# Patient Record
Sex: Female | Born: 1965 | Marital: Married | Smoking: Never smoker
Health system: Southern US, Community
[De-identification: ages and names within clinical notes are randomized; demographics above are authoritative.]

## PROBLEM LIST (undated history)

## (undated) DIAGNOSIS — G43909 Migraine, unspecified, not intractable, without status migrainosus: Secondary | ICD-10-CM

## (undated) DIAGNOSIS — F419 Anxiety disorder, unspecified: Secondary | ICD-10-CM

## (undated) DIAGNOSIS — K760 Fatty (change of) liver, not elsewhere classified: Secondary | ICD-10-CM

## (undated) DIAGNOSIS — Z87442 Personal history of urinary calculi: Secondary | ICD-10-CM

## (undated) DIAGNOSIS — M1990 Unspecified osteoarthritis, unspecified site: Secondary | ICD-10-CM

## (undated) DIAGNOSIS — J45909 Unspecified asthma, uncomplicated: Secondary | ICD-10-CM

## (undated) DIAGNOSIS — J189 Pneumonia, unspecified organism: Secondary | ICD-10-CM

## (undated) HISTORY — DX: Unspecified osteoarthritis, unspecified site: M19.90

## (undated) HISTORY — DX: Anxiety disorder, unspecified: F41.9

## (undated) HISTORY — DX: Personal history of urinary calculi: Z87.442

## (undated) HISTORY — DX: Fatty (change of) liver, not elsewhere classified: K76.0

## (undated) HISTORY — DX: Migraine, unspecified, not intractable, without status migrainosus: G43.909

---

## 2015-12-05 ENCOUNTER — Encounter: Payer: Self-pay | Admitting: Physician Assistant

## 2015-12-05 ENCOUNTER — Ambulatory Visit: Payer: Self-pay | Admitting: Physician Assistant

## 2015-12-05 VITALS — BP 126/68 | HR 77 | Temp 97.7°F | Ht 60.75 in | Wt 141.0 lb

## 2015-12-05 DIAGNOSIS — K219 Gastro-esophageal reflux disease without esophagitis: Secondary | ICD-10-CM

## 2015-12-05 DIAGNOSIS — T148XXA Other injury of unspecified body region, initial encounter: Secondary | ICD-10-CM

## 2015-12-05 DIAGNOSIS — Z131 Encounter for screening for diabetes mellitus: Secondary | ICD-10-CM

## 2015-12-05 DIAGNOSIS — Z13220 Encounter for screening for lipoid disorders: Secondary | ICD-10-CM

## 2015-12-05 LAB — GLUCOSE, POCT (MANUAL RESULT ENTRY): POC Glucose: 84 mg/dL (ref 70–99)

## 2015-12-05 NOTE — Progress Notes (Signed)
BP 126/68 (BP Location: Left Arm, Patient Position: Sitting, Cuff Size: Normal)   Pulse 77   Temp 97.7 F (36.5 C)   Ht 5' 0.75" (1.543 m)   Wt 141 lb (64 kg)   SpO2 99%   BMI 26.86 kg/m    Subjective:    Patient ID: Kathryn Salas, female    DOB: 10/27/1965, 50 y.o.   MRN: [id number]  HPI: Kathryn Salas is a 50 y.o. female presenting on 12/05/2015 for New Patient (Initial Visit)   HPI  [place]  [place] She is poor historian.  Records she has with her show that her Previous meds protonix and topamax.   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  EGD/colonoscopy reprort from 07/10/12- polyp with rec to repeat colonoscopy in 5 year.   Pt says she was given topamax for HA but says she never took it.   Pt says her mammogram something was "off".  She thinks her last one was oct or nov last year.   She says last PAP was done last year.   Relevant past medical, surgical, family and social history reviewed and updated as indicated. Interim medical history since our last visit reviewed. Allergies and medications reviewed and updated.   Current Outpatient Prescriptions:  .  famotidine (PEPCID) 20 MG tablet, Take 20 mg by mouth 3 times/day as needed-between meals & bedtime for heartburn or indigestion., Disp: , Rfl:  .  ibuprofen (ADVIL,MOTRIN) 200 MG tablet, Take 400-600 mg by mouth daily as needed for headache or moderate pain., Disp: , Rfl:  .  methocarbamol (ROBAXIN) 500 MG tablet, Take 500 mg by mouth 4 (four) times daily., Disp: , Rfl:    Review of Systems  Constitutional: Negative for appetite change, chills, diaphoresis, fatigue, fever and unexpected weight change.  HENT: Positive for dental problem. Negative for congestion, drooling, ear pain, facial swelling, hearing loss, mouth sores, sneezing, sore throat, trouble swallowing and voice change.   Eyes: Positive for  redness. Negative for pain, discharge, itching and visual disturbance.  Respiratory: Negative for cough, choking, shortness of breath and wheezing.   Cardiovascular: Negative for chest pain, palpitations and leg swelling.  Gastrointestinal: Negative for abdominal pain, blood in stool, constipation, diarrhea and vomiting.  Endocrine: Negative for cold intolerance, heat intolerance and polydipsia.  Genitourinary: Negative for decreased urine volume, dysuria and hematuria.  Musculoskeletal: Positive for back pain. Negative for arthralgias and gait problem.  Skin: Negative for rash.  Allergic/Immunologic: Negative for environmental allergies.  Neurological: Positive for headaches. Negative for seizures, syncope and light-headedness.  Hematological: Negative for adenopathy.  Psychiatric/Behavioral: Negative for agitation, dysphoric mood and suicidal ideas. The patient is nervous/anxious.     Per HPI unless specifically indicated above     Objective:    BP 126/68 (BP Location: Left Arm, Patient Position: Sitting, Cuff Size: Normal)   Pulse 77   Temp 97.7 F (36.5 C)   Ht 5' 0.75" (1.543 m)   Wt 141 lb (64 kg)   SpO2 99%   BMI 26.86 kg/m   Wt Readings from Last 3 Encounters:  12/05/15 141 lb (64 kg)    Physical Exam  Constitutional: She is oriented to person, place, and time. She appears well-developed and well-nourished.  HENT:  Head: Normocephalic and atraumatic.  Mouth/Throat: Oropharynx is clear and moist. No oropharyngeal exudate.  Eyes: Conjunctivae and EOM are normal. Pupils are equal, round, and reactive to light.  Neck: Neck supple. No thyromegaly present.  Cardiovascular: Normal rate and regular rhythm.   Pulmonary/Chest: Effort normal and breath sounds normal.  Abdominal: Soft. Bowel sounds are normal. She exhibits no mass. There is no hepatosplenomegaly. There is no tenderness.  Musculoskeletal: She exhibits no edema.  Lymphadenopathy:    She has no cervical  adenopathy.  Neurological: She is alert and oriented to person, place, and time. Gait normal.  Skin: Skin is warm and dry. Bruising noted.  Many bruises on all extremities. No bruising seen on torso  Psychiatric: She has a normal mood and affect. Her behavior is normal.  Vitals reviewed.      Results for orders placed or performed in visit on 12/05/15  POCT Glucose (CBG)  Result Value Ref Range   POC Glucose 84 70 - 99 mg/dl      Assessment & Plan:   Encounter Diagnoses  Name Primary?  . Screening for diabetes mellitus (DM) Yes  . Screening cholesterol level   . Bruising   . Gastroesophageal reflux disease, esophagitis presence not specified      -Check baseline labs -record request sent for last eye exam, mammogram and pap -F/u 1 month.  RTO sooner prn

## 2015-12-07 LAB — CBC WITH DIFFERENTIAL/PLATELET
BASOS PCT: 0 %
Basophils Absolute: 0 cells/uL (ref 0–200)
EOS ABS: 180 {cells}/uL (ref 15–500)
Eosinophils Relative: 3 %
HEMATOCRIT: 39.9 % (ref 35.0–45.0)
Hemoglobin: 13.4 g/dL (ref 11.7–15.5)
Lymphocytes Relative: 38 %
Lymphs Abs: 2280 cells/uL (ref 850–3900)
MCH: 28.9 pg (ref 27.0–33.0)
MCHC: 33.6 g/dL (ref 32.0–36.0)
MCV: 86 fL (ref 80.0–100.0)
MONO ABS: 360 {cells}/uL (ref 200–950)
MPV: 10.5 fL (ref 7.5–12.5)
Monocytes Relative: 6 %
NEUTROS ABS: 3180 {cells}/uL (ref 1500–7800)
Neutrophils Relative %: 53 %
PLATELETS: 218 10*3/uL (ref 140–400)
RBC: 4.64 MIL/uL (ref 3.80–5.10)
RDW: 13.2 % (ref 11.0–15.0)
WBC: 6 10*3/uL (ref 3.8–10.8)

## 2015-12-08 LAB — COMPREHENSIVE METABOLIC PANEL
ALT: 14 U/L (ref 6–29)
AST: 18 U/L (ref 10–35)
Albumin: 4.5 g/dL (ref 3.6–5.1)
Alkaline Phosphatase: 69 U/L (ref 33–115)
BUN: 18 mg/dL (ref 7–25)
CO2: 27 mmol/L (ref 20–31)
Calcium: 10.1 mg/dL (ref 8.6–10.2)
Chloride: 104 mmol/L (ref 98–110)
Creat: 0.67 mg/dL (ref 0.50–1.10)
GLUCOSE: 81 mg/dL (ref 65–99)
Potassium: 5.1 mmol/L (ref 3.5–5.3)
SODIUM: 140 mmol/L (ref 135–146)
TOTAL PROTEIN: 7.5 g/dL (ref 6.1–8.1)
Total Bilirubin: 0.6 mg/dL (ref 0.2–1.2)

## 2015-12-08 LAB — HEMOGLOBIN A1C
HEMOGLOBIN A1C: 5.4 % (ref <5.7)
Mean Plasma Glucose: 108 mg/dL

## 2015-12-08 LAB — LIPID PANEL
Cholesterol: 182 mg/dL (ref 125–200)
HDL: 69 mg/dL (ref >=46)
LDL CALC: 101 mg/dL (ref <130)
TRIGLYCERIDES: 60 mg/dL (ref <150)
Total CHOL/HDL Ratio: 2.6 Ratio (ref <=5.0)
VLDL: 12 mg/dL (ref <30)

## 2015-12-08 LAB — PROTIME-INR
INR: 1
Prothrombin Time: 10.2 s (ref 9.0–11.5)

## 2016-01-02 ENCOUNTER — Ambulatory Visit: Payer: Self-pay | Admitting: Physician Assistant

## 2016-01-04 ENCOUNTER — Ambulatory Visit: Payer: Self-pay | Admitting: Physician Assistant

## 2016-01-04 ENCOUNTER — Encounter: Payer: Self-pay | Admitting: Physician Assistant

## 2016-01-04 VITALS — BP 126/78 | HR 78 | Ht 60.75 in | Wt 145.0 lb

## 2016-01-04 DIAGNOSIS — G8929 Other chronic pain: Secondary | ICD-10-CM

## 2016-01-04 DIAGNOSIS — K219 Gastro-esophageal reflux disease without esophagitis: Secondary | ICD-10-CM | POA: Insufficient documentation

## 2016-01-04 DIAGNOSIS — Z1239 Encounter for other screening for malignant neoplasm of breast: Secondary | ICD-10-CM

## 2016-01-04 DIAGNOSIS — M549 Dorsalgia, unspecified: Secondary | ICD-10-CM

## 2016-01-04 NOTE — Progress Notes (Signed)
BP 126/78 (BP Location: Left Arm, Patient Position: Sitting, Cuff Size: Normal)   Pulse 78   Ht 5' 0.75" (1.543 m)   Wt 145 lb (65.8 kg)   SpO2 98%   BMI 27.62 kg/m    Subjective:    Patient ID: Kathryn Salas, female    DOB: 05/05/1965, 50 y.o.   MRN: [id number]  HPI: Kathryn Salas is a 50 y.o. female presenting on 01/04/2016 for No chief complaint on file.   HPI   Pt is here for follow up  Relevant past medical, surgical, family and social history reviewed and updated as indicated. Interim medical history since our last visit reviewed. Allergies and medications reviewed and updated.   Current Outpatient Prescriptions:  .  famotidine (PEPCID) 20 MG tablet, Take 20 mg by mouth 3 times/day as needed-between meals & bedtime for heartburn or indigestion., Disp: , Rfl:  .  ibuprofen (ADVIL,MOTRIN) 200 MG tablet, Take 400-600 mg by mouth daily as needed for headache or moderate pain., Disp: , Rfl:  .  methocarbamol (ROBAXIN) 500 MG tablet, Take 500 mg by mouth 4 (four) times daily., Disp: , Rfl:    Review of Systems  Constitutional: Negative for appetite change, chills, diaphoresis, fatigue, fever and unexpected weight change.  HENT: Positive for dental problem. Negative for congestion, drooling, ear pain, facial swelling, hearing loss, mouth sores, sneezing, sore throat, trouble swallowing and voice change.   Eyes: Negative for pain, discharge, redness, itching and visual disturbance.  Respiratory: Negative for cough, choking, shortness of breath and wheezing.   Cardiovascular: Negative for chest pain, palpitations and leg swelling.  Gastrointestinal: Negative for abdominal pain, blood in stool, constipation, diarrhea and vomiting.  Endocrine: Negative for cold intolerance, heat intolerance and polydipsia.  Genitourinary: Negative for decreased urine volume, dysuria and hematuria.  Musculoskeletal: Positive for arthralgias and back pain. Negative for gait problem.  Skin: Negative  for rash.  Allergic/Immunologic: Negative for environmental allergies.  Neurological: Negative for seizures, syncope, light-headedness and headaches.  Hematological: Negative for adenopathy.  Psychiatric/Behavioral: Negative for agitation, dysphoric mood and suicidal ideas. The patient is not nervous/anxious.     Per HPI unless specifically indicated above     Objective:    BP 126/78 (BP Location: Left Arm, Patient Position: Sitting, Cuff Size: Normal)   Pulse 78   Ht 5' 0.75" (1.543 m)   Wt 145 lb (65.8 kg)   SpO2 98%   BMI 27.62 kg/m   Wt Readings from Last 3 Encounters:  01/04/16 145 lb (65.8 kg)  12/05/15 141 lb (64 kg)    Physical Exam  Constitutional: She is oriented to person, place, and time. She appears well-developed and well-nourished.  HENT:  Head: Normocephalic and atraumatic.  Neck: Neck supple.  Cardiovascular: Normal rate and regular rhythm.   Pulmonary/Chest: Effort normal and breath sounds normal.  Abdominal: Soft. Bowel sounds are normal. She exhibits no mass. There is no hepatosplenomegaly. There is no tenderness.  Musculoskeletal: She exhibits no edema.  Lymphadenopathy:    She has no cervical adenopathy.  Neurological: She is alert and oriented to person, place, and time.  Skin: Skin is warm and dry.  Psychiatric: She has a normal mood and affect. Her behavior is normal.  Vitals reviewed.   Results for orders placed or performed in visit on 12/05/15  HgB A1c  Result Value Ref Range   Hgb A1c MFr Bld 5.4 <5.7 %   Mean Plasma Glucose 108 mg/dL  Lipid Profile  Result Value Ref Range  Cholesterol 182 125 - 200 mg/dL   Triglycerides 60 <150 mg/dL   HDL 69 >=46 mg/dL   Total CHOL/HDL Ratio 2.6 <=5.0 Ratio   VLDL 12 <30 mg/dL   LDL Cholesterol 101 <130 mg/dL  Comprehensive Metabolic Panel (CMET)  Result Value Ref Range   Sodium 140 135 - 146 mmol/L   Potassium 5.1 3.5 - 5.3 mmol/L   Chloride 104 98 - 110 mmol/L   CO2 27 20 - 31 mmol/L    Glucose, Bld 81 65 - 99 mg/dL   BUN 18 7 - 25 mg/dL   Creat 0.67 0.50 - 1.10 mg/dL   Total Bilirubin 0.6 0.2 - 1.2 mg/dL   Alkaline Phosphatase 69 33 - 115 U/L   AST 18 10 - 35 U/L   ALT 14 6 - 29 U/L   Total Protein 7.5 6.1 - 8.1 g/dL   Albumin 4.5 3.6 - 5.1 g/dL   Calcium 10.1 8.6 - 10.2 mg/dL  CBC w/Diff/Platelet  Result Value Ref Range   WBC 6.0 3.8 - 10.8 K/uL   RBC 4.64 3.80 - 5.10 MIL/uL   Hemoglobin 13.4 11.7 - 15.5 g/dL   HCT 39.9 35.0 - 45.0 %   MCV 86.0 80.0 - 100.0 fL   MCH 28.9 27.0 - 33.0 pg   MCHC 33.6 32.0 - 36.0 g/dL   RDW 13.2 11.0 - 15.0 %   Platelets 218 140 - 400 K/uL   MPV 10.5 7.5 - 12.5 fL   Neutro Abs 3,180 1,500 - 7,800 cells/uL   Lymphs Abs 2,280 850 - 3,900 cells/uL   Monocytes Absolute 360 200 - 950 cells/uL   Eosinophils Absolute 180 15 - 500 cells/uL   Basophils Absolute 0 0 - 200 cells/uL   Neutrophils Relative % 53 %   Lymphocytes Relative 38 %   Monocytes Relative 6 %   Eosinophils Relative 3 %   Basophils Relative 0 %   Smear Review Criteria for review not met   INR/PT  Result Value Ref Range   Prothrombin Time 10.2 9.0 - 11.5 sec   INR 1.0   POCT Glucose (CBG)  Result Value Ref Range   POC Glucose 84 70 - 99 mg/dl      Assessment & Plan:   Encounter Diagnoses  Name Primary?  . Gastroesophageal reflux disease, esophagitis presence not specified Yes  . Screening for breast cancer   . Chronic back pain, unspecified back location, unspecified back pain laterality      -reviewed labs with pt -screening mammogram -will check to see if there is funding for eye doctor -pt encouraged to Check for local pain clinic- discussed with pt we don't manage chronic pain -follow up in may for PAP.  RTO sooner prn

## 2016-01-31 ENCOUNTER — Encounter (HOSPITAL_COMMUNITY): Payer: Self-pay

## 2016-02-22 ENCOUNTER — Encounter: Payer: Self-pay | Admitting: Physician Assistant

## 2016-02-22 ENCOUNTER — Ambulatory Visit: Payer: Self-pay | Admitting: Physician Assistant

## 2016-02-22 VITALS — BP 118/74 | HR 71 | Temp 97.7°F | Ht 60.75 in | Wt 148.4 lb

## 2016-02-22 DIAGNOSIS — K219 Gastro-esophageal reflux disease without esophagitis: Secondary | ICD-10-CM

## 2016-02-22 DIAGNOSIS — Z86010 Personal history of colonic polyps: Secondary | ICD-10-CM

## 2016-02-22 MED ORDER — PANTOPRAZOLE SODIUM 40 MG PO TBEC: DELAYED_RELEASE_TABLET | ORAL | 3 refills | Status: DC

## 2016-02-22 NOTE — Patient Instructions (Signed)
Gammagrafa de reflujo gastroesofgico (Gastroesophageal Reflux Scan) La gammagrafa de reflujo gastroesofgico es un procedimiento que se realiza para Landscape architectdetectar la presencia de reflujo gastroesofgico, que es el flujo retrgrado de los contenidos estomacales hacia el conducto que lleva los alimentos desde la boca hasta el estmago (esfago). Adems, puede revelar si se produce la inhalacin (aspiracin) de estos contenidos RadioShackhacia los pulmones. Tal vez deba realizarse esta gammagrafa si tiene sntomas, como acidez, vmitos, problemas de deglucin o regurgitacin. La regurgitacin ocurre cuando los alimentos ingeridos regresan desde el estmago al esfago. Para este estudio, beber un lquido que contiene una pequea cantidad de una sustancia radiactiva Radiographer, therapeutic(marcador). Se B[place]ausa un escner con Neomia Dearuna cmara que detecta el marcador radiactivo para determinar si el material regresa al esfago. INFORME A SU MDICO:  Cualquier alergia que tenga.  Todos los Walt Disneymedicamentos que utiliza, incluidos vitaminas, hierbas, gotas oftlmicas, cremas y [address]amentos de [address] libre.  Enfermedades de la sangre que tenga.  Si tiene cirugas previas.  Si tiene Owens-Illinoisalguna enfermedad.  Si est embarazada o cree que podra estarlo.  Si est amamantando. RIESGOS Y COMPLICACIONES En general, se trata de un procedimiento seguro. Sin embargo, pueden presentarse problemas, por ejemplo:  Exposicin a la radiacin (pequea cantidad).  Reaccin alrgica a la sustancia radiactiva. Esto es raro. ANTES DEL PROCEDIMIENTO  Consulte a su mdico si debe cambiar o suspender los medicamentos que toma habitualmente. Esto es muy importante si toma medicamentos para la diabetes o anticoagulantes.  Siga las indicaciones del mdico respecto de las restricciones para las comidas o las bebidas. PROCEDIMIENTO  Se le indicar que beba un lquido que contiene una pequea cantidad de un marcador radiactivo. Es probable que este lquido sea parecido al  jugo de naranjas.  Se acostar boca arriba.  Le tomarn una serie de imgenes del esfago y de la parte superior del Homesteadestmago.  Tal vez le pidan que cambie de posicin para ayudar a Chief Strategy Officerdeterminar si el reflujo es ms frecuente cuando adopta posiciones especficas.  Es posible que a los adultos se les coloque una faja abdominal con una almohadilla inflable sobre el vientre (abdomen), la cual puede usarse para aumentar la presin abdominal. Le tomarn ms imgenes para saber si se produce reflujo al aumentar la presin. Este procedimiento puede variar segn el mdico y el hospital. DESPUS DEL PROCEDIMIENTO  Reanude su dieta y sus actividades normales como se lo haya indicado el mdico.  El marcador radiactivo se eliminar del cuerpo luego de Culbertsonalgunos das. Beba suficiente lquido para Photographermantener la orina clara o de color amarillo plido. Esto ayudar a Event organisereliminar el marcador del cuerpo.  Es su responsabilidad retirar el resultado del [place]udio. Pregntele al mdico o consulte en el departamento donde se realice el estudio cundo y cmo obtendr los Abandaresultados. Esta informacin no tiene Theme park managercomo fin reemplazar el consejo del mdico. Asegrese de hacerle al mdico cualquier pregunta que tenga. Document Released: 12/23/2012 Document Revised: 03/25/2014 Elsevier Interactive Patient Education  2017 [employer]

## 2016-02-22 NOTE — Progress Notes (Signed)
BP 118/74 (BP Location: Left Arm, Patient Position: Sitting, Cuff Size: Normal)   Pulse 71   Temp 97.7 F (36.5 C) (Other (Comment))   Ht 5' 0.75" (1.543 m)   Wt 148 lb 6.4 oz (67.3 kg)   SpO2 99%   BMI 28.27 kg/m    Subjective:    Patient ID: Kathryn Salas, female    DOB: 08/26/1965, 50 y.o.   MRN: [id number]  HPI: Kathryn Salas is a 50 y.o. female presenting on 02/22/2016 for Abdominal Pain (c/o painful acid reflux for 3 weeks)   HPI  Pt takes IBU only when she has HA, maybe 10 or 12 times/month.   She started taking the otc omeprazole 2 or 3 wk ago.    Stomach pain comes and goes. Says every time he eats it hurts . She gets nausea after eating but no emesis.  BMs normal.  Pt states EGD and colonoscopy 2 years ago. Found polyp but egd normal  Egd/colonoscopy- 02/15/15- polyp, h pylori negative, rec repeat 5 year ([facility], [place])  Relevant past medical, surgical, family and social history reviewed and updated as indicated. Interim medical history since our last visit reviewed. Allergies and medications reviewed and updated.   Current Outpatient Prescriptions:  .  famotidine (PEPCID) 20 MG tablet, Take 20 mg by mouth 3 times/day as needed-between meals & bedtime for heartburn or indigestion., Disp: , Rfl:  .  ibuprofen (ADVIL,MOTRIN) 200 MG tablet, Take 400-600 mg by mouth daily as needed for headache or moderate pain., Disp: , Rfl:  .  methocarbamol (ROBAXIN) 500 MG tablet, Take 500 mg by mouth 4 (four) times daily as needed. , Disp: , Rfl:  .  omeprazole (PRILOSEC OTC) 20 MG tablet, Take 20 mg by mouth daily., Disp: , Rfl:    Review of Systems  Constitutional: Negative for appetite change, chills, diaphoresis, fatigue, fever and unexpected weight change.  HENT: Positive for dental problem. Negative for congestion, drooling, ear pain, facial swelling, hearing loss, mouth sores, sneezing, sore throat, trouble swallowing and voice change.   Eyes:  Negative for pain, discharge, redness, itching and visual disturbance.  Respiratory: Negative for cough, choking, shortness of breath and wheezing.   Cardiovascular: Negative for chest pain, palpitations and leg swelling.  Gastrointestinal: Positive for abdominal pain. Negative for blood in stool, constipation, diarrhea and vomiting.  Endocrine: Negative for cold intolerance, heat intolerance and polydipsia.  Genitourinary: Negative for decreased urine volume, dysuria and hematuria.  Musculoskeletal: Positive for arthralgias and back pain. Negative for gait problem.  Skin: Negative for rash.  Allergic/Immunologic: Negative for environmental allergies.  Neurological: Negative for seizures, syncope, light-headedness and headaches.  Hematological: Negative for adenopathy.  Psychiatric/Behavioral: Negative for agitation, dysphoric mood and suicidal ideas. The patient is not nervous/anxious.     Per HPI unless specifically indicated above     Objective:    BP 118/74 (BP Location: Left Arm, Patient Position: Sitting, Cuff Size: Normal)   Pulse 71   Temp 97.7 F (36.5 C) (Other (Comment))   Ht 5' 0.75" (1.543 m)   Wt 148 lb 6.4 oz (67.3 kg)   SpO2 99%   BMI 28.27 kg/m   Wt Readings from Last 3 Encounters:  02/22/16 148 lb 6.4 oz (67.3 kg)  01/04/16 145 lb (65.8 kg)  12/05/15 141 lb (64 kg)    Physical Exam  Constitutional: She is oriented to person, place, and time. She appears well-developed and well-nourished.  HENT:  Head: Normocephalic and atraumatic.  Right Ear: Hearing normal.  Left Ear: Hearing normal.  Neck: Neck supple.  Cardiovascular: Normal rate and regular rhythm.   Pulmonary/Chest: Effort normal and breath sounds normal. She has no wheezes.  Abdominal: Soft. Bowel sounds are normal. She exhibits no mass. There is no hepatosplenomegaly. There is no tenderness.  Musculoskeletal: She exhibits no edema.  Lymphadenopathy:    She has no cervical adenopathy.   Neurological: She is alert and oriented to person, place, and time.  Skin: Skin is warm and dry.  Psychiatric: She has a normal mood and affect. Her behavior is normal.  Vitals reviewed.      Assessment & Plan:   Encounter Diagnoses  Name Primary?  . Gastroesophageal reflux disease, esophagitis presence not specified Yes  . History of colonic polyps     Stop pepcid and omeprazole.  rx protonix  Counseled on lifestyle changes and gave handout  F/u 1 month to recheck

## 2016-02-29 ENCOUNTER — Ambulatory Visit (HOSPITAL_COMMUNITY)
Admission: RE | Admit: 2016-02-29 | Discharge: 2016-02-29 | Disposition: A | Discharge status: Home / Self Care | Payer: Self-pay | Source: Ambulatory Visit | Attending: Physician Assistant | Admitting: Physician Assistant

## 2016-02-29 ENCOUNTER — Other Ambulatory Visit: Payer: Self-pay | Admitting: Physician Assistant

## 2016-02-29 ENCOUNTER — Ambulatory Visit (HOSPITAL_COMMUNITY): Admission: RE | Admit: 2016-02-29 | Payer: Self-pay | Source: Ambulatory Visit

## 2016-02-29 ENCOUNTER — Encounter (HOSPITAL_COMMUNITY): Payer: Self-pay

## 2016-02-29 DIAGNOSIS — Z1231 Encounter for screening mammogram for malignant neoplasm of breast: Secondary | ICD-10-CM

## 2016-04-03 ENCOUNTER — Ambulatory Visit: Payer: Self-pay | Admitting: Physician Assistant

## 2016-04-04 ENCOUNTER — Ambulatory Visit: Payer: Self-pay | Admitting: Physician Assistant

## 2016-04-10 ENCOUNTER — Ambulatory Visit: Payer: Self-pay | Admitting: Physician Assistant

## 2016-04-11 ENCOUNTER — Encounter: Payer: Self-pay | Admitting: Physician Assistant

## 2016-04-16 ENCOUNTER — Encounter: Payer: Self-pay | Admitting: Physician Assistant

## 2016-04-16 ENCOUNTER — Ambulatory Visit: Payer: Self-pay | Admitting: Physician Assistant

## 2016-04-16 VITALS — BP 126/70 | HR 83 | Temp 97.7°F | Wt 150.2 lb

## 2016-04-16 DIAGNOSIS — K219 Gastro-esophageal reflux disease without esophagitis: Secondary | ICD-10-CM

## 2016-04-16 DIAGNOSIS — R5383 Other fatigue: Secondary | ICD-10-CM

## 2016-04-16 LAB — TSH: TSH: 1.67 m[IU]/L

## 2016-04-16 LAB — HEMOGLOBIN: HEMOGLOBIN: 13.1 g/dL (ref 11.7–15.5)

## 2016-04-16 NOTE — Progress Notes (Signed)
BP 126/70 (BP Location: Left Arm, Patient Position: Sitting, Cuff Size: Normal)   Pulse 83   Temp 97.7 F (36.5 C)   Wt 150 lb 4 oz (68.2 kg)   SpO2 97%   BMI 28.62 kg/m    Subjective:    Patient ID: Kathryn Salas, female    DOB: 21-Dec-1965, 51 y.o.   MRN: [id number]  HPI: Kathryn Salas is a 51 y.o. female presenting on 04/16/2016 for Gastroesophageal Reflux and Fatigue (pt states at times she feels with little energy)   HPI   Chief Complaint  Patient presents with  . Gastroesophageal Reflux  . Fatigue    pt states at times she feels with little energy    Pt has some heartburn symptoms.  She did not get her meds refilled.   Pt cleans houses for work.  She feels tired and sleepy sometimes. Started more within last week or two.  She gets 8 hour sleep most nights.   She started going to [facility] about 3 months ago.   Relevant past medical, surgical, family and social history reviewed and updated as indicated. Interim medical history since our last visit reviewed. Allergies and medications reviewed and updated.  . Current Outpatient Prescriptions:  .  ibuprofen (ADVIL,MOTRIN) 200 MG tablet, Take 400-600 mg by mouth daily as needed for headache or moderate pain., Disp: , Rfl:  .  famotidine (PEPCID) 20 MG tablet, Take 20 mg by mouth 3 times/day as needed-between meals & bedtime for heartburn or indigestion., Disp: , Rfl:  .  methocarbamol (ROBAXIN) 500 MG tablet, Take 500 mg by mouth 4 (four) times daily as needed. , Disp: , Rfl:  .  omeprazole (PRILOSEC OTC) 20 MG tablet, Take 20 mg by mouth daily., Disp: , Rfl:  .  pantoprazole (PROTONIX) 40 MG tablet, 1 po bid.  Tome una tableta por boca dos veces diarias (Patient not taking: Reported on 04/16/2016), Disp: 60 tablet, Rfl: 3  Review of Systems  Constitutional: Negative for appetite change, chills, diaphoresis, fatigue, fever and unexpected weight change.  HENT: Positive for dental problem. Negative for congestion, drooling, ear  pain, facial swelling, hearing loss, mouth sores, sneezing, sore throat, trouble swallowing and voice change.   Eyes: Negative for pain, discharge, redness, itching and visual disturbance.  Respiratory: Negative for cough, choking, shortness of breath and wheezing.   Cardiovascular: Negative for chest pain, palpitations and leg swelling.  Gastrointestinal: Negative for abdominal pain, blood in stool, constipation, diarrhea and vomiting.  Endocrine: Negative for cold intolerance, heat intolerance and polydipsia.  Genitourinary: Negative for decreased urine volume, dysuria and hematuria.  Musculoskeletal: Positive for arthralgias and back pain. Negative for gait problem.  Skin: Negative for rash.  Allergic/Immunologic: Negative for environmental allergies.  Neurological: Positive for headaches. Negative for seizures, syncope and light-headedness.  Hematological: Negative for adenopathy.  Psychiatric/Behavioral: Negative for agitation, dysphoric mood and suicidal ideas. The patient is not nervous/anxious.     Per HPI unless specifically indicated above     Objective:    BP 126/70 (BP Location: Left Arm, Patient Position: Sitting, Cuff Size: Normal)   Pulse 83   Temp 97.7 F (36.5 C)   Wt 150 lb 4 oz (68.2 kg)   SpO2 97%   BMI 28.62 kg/m   Wt Readings from Last 3 Encounters:  04/16/16 150 lb 4 oz (68.2 kg)  02/22/16 148 lb 6.4 oz (67.3 kg)  01/04/16 145 lb (65.8 kg)    Physical Exam  Constitutional: She is oriented to  person, place, and time. She appears well-developed and well-nourished.  HENT:  Head: Normocephalic and atraumatic.  Neck: Neck supple. No thyroid mass and no thyromegaly present.  Cardiovascular: Normal rate and regular rhythm.   Pulmonary/Chest: Effort normal and breath sounds normal.  Abdominal: Soft. Bowel sounds are normal. She exhibits no mass. There is no hepatosplenomegaly. There is no tenderness.  Musculoskeletal: She exhibits no edema.  Lymphadenopathy:     She has no cervical adenopathy.  Neurological: She is alert and oriented to person, place, and time.  Skin: Skin is warm and dry.  Psychiatric: She has a normal mood and affect. Her behavior is normal.  Vitals reviewed.   Results for orders placed or performed in visit on 12/05/15  HgB A1c  Result Value Ref Range   Hgb A1c MFr Bld 5.4 <5.7 %   Mean Plasma Glucose 108 mg/dL  Lipid Profile  Result Value Ref Range   Cholesterol 182 125 - 200 mg/dL   Triglycerides 60 <150 mg/dL   HDL 69 >=46 mg/dL   Total CHOL/HDL Ratio 2.6 <=5.0 Ratio   VLDL 12 <30 mg/dL   LDL Cholesterol 101 <130 mg/dL  Comprehensive Metabolic Panel (CMET)  Result Value Ref Range   Sodium 140 135 - 146 mmol/L   Potassium 5.1 3.5 - 5.3 mmol/L   Chloride 104 98 - 110 mmol/L   CO2 27 20 - 31 mmol/L   Glucose, Bld 81 65 - 99 mg/dL   BUN 18 7 - 25 mg/dL   Creat 0.67 0.50 - 1.10 mg/dL   Total Bilirubin 0.6 0.2 - 1.2 mg/dL   Alkaline Phosphatase 69 33 - 115 U/L   AST 18 10 - 35 U/L   ALT 14 6 - 29 U/L   Total Protein 7.5 6.1 - 8.1 g/dL   Albumin 4.5 3.6 - 5.1 g/dL   Calcium 10.1 8.6 - 10.2 mg/dL  CBC w/Diff/Platelet  Result Value Ref Range   WBC 6.0 3.8 - 10.8 K/uL   RBC 4.64 3.80 - 5.10 MIL/uL   Hemoglobin 13.4 11.7 - 15.5 g/dL   HCT 39.9 35.0 - 45.0 %   MCV 86.0 80.0 - 100.0 fL   MCH 28.9 27.0 - 33.0 pg   MCHC 33.6 32.0 - 36.0 g/dL   RDW 13.2 11.0 - 15.0 %   Platelets 218 140 - 400 K/uL   MPV 10.5 7.5 - 12.5 fL   Neutro Abs 3,180 1,500 - 7,800 cells/uL   Lymphs Abs 2,280 850 - 3,900 cells/uL   Monocytes Absolute 360 200 - 950 cells/uL   Eosinophils Absolute 180 15 - 500 cells/uL   Basophils Absolute 0 0 - 200 cells/uL   Neutrophils Relative % 53 %   Lymphocytes Relative 38 %   Monocytes Relative 6 %   Eosinophils Relative 3 %   Basophils Relative 0 %   Smear Review Criteria for review not met   INR/PT  Result Value Ref Range   Prothrombin Time 10.2 9.0 - 11.5 sec   INR 1.0   POCT Glucose  (CBG)  Result Value Ref Range   POC Glucose 84 70 - 99 mg/dl      Assessment & Plan:   Encounter Diagnoses  Name Primary?  . Gastroesophageal reflux disease, esophagitis presence not specified Yes  . Fatigue, unspecified type      -reviewed labs with pt.  Will check TSH in light of fatigue but discussed with pt likely due to her activity  Level.  Encouraged plenty  of sleep, nutritious diet and regular exercise to help avoid fatigue -encouraged pt to refill her meds to prevent the return of her GERD symptoms -follow up as scheduled.  RTO sooner prn

## 2016-04-16 NOTE — Patient Instructions (Signed)
Fatiga  (Fatigue)  La fatiga es una sensación de cansancio en todo momento, falta de energía o falta de motivación. La fatiga ocasional o leve con frecuencia es una reacción normal a la actividad o la vida en general. Sin embargo, la fatiga de larga duración (crónica) o extrema puede indicar una enfermedad preexistente.  INSTRUCCIONES PARA EL CUIDADO EN EL HOGAR  Controle su fatiga para ver si hay cambios. Las siguientes indicaciones ayudarán a aliviar cualquier molestia que pueda sentir:  · Hable con el médico acerca de cuánto debe dormir cada noche. Trate de dormir la cantidad de tiempo requerida todas las noches.  · Tome los medicamentos solamente como se lo haya indicado el médico.  · Siga una dieta saludable y nutritiva. Pida ayuda al médico si necesita hacer cambios en su dieta.  · Beba suficiente líquido para mantener la orina clara o de color amarillo pálido.  · Practique actividades que lo relajen, como yoga, meditación, terapia de masajes o acupuntura.  · Haga ejercicios regularmente.  · Cambie las situaciones que le provocan estrés. Trate de que su rutina de trabajo y personal sea moderada.  · No consuma drogas.  · Limite el consumo de alcohol a no más de 1 medida por día si es mujer y no está embarazada, y 2 medidas si es hombre. Una medida equivale a 12 onzas de cerveza, 5 onzas de vino o 1½ onzas de bebidas alcohólicas de alta graduación.  · Tome una multivitamina, si se lo indicó el médico.  SOLICITE ATENCIÓN MÉDICA SI:  · La fatiga no mejora.  · Tiene fiebre.  · Tiene pérdida o aumento involuntario de peso.  · Tiene dolores de cabeza.  · Tiene dificultad para:  ? Dormirse.  ? Dormir durante toda la noche.  · Se siente enojado, culpable, ansioso o triste.  · No puede defecar (estreñimiento).  · Tiene la piel seca.  · Tiene hinchadas las piernas u otra parte del cuerpo.    SOLICITE ATENCIÓN MÉDICA DE INMEDIATO SI:  · Se siente confundido.  · Tiene visión borrosa.  · Sufre mareos o se desmaya.  · Sufre  un dolor intenso de cabeza.  · Siente dolor intenso en el abdomen, la pelvis o la espalda.  · Tiene dolor de pecho, dificultad para respirar, o latidos cardíacos irregulares o acelerados.  · No puede orinar u orina menos de lo normal.  · Presenta sangrado anormal, como sangrado del recto, la vagina, la nariz, los pulmones o los pezones.  · Vomita sangre.  · Tiene pensamientos acerca de hacerse daño a sí mismo o cometer suicidio.  · Le preocupa la posibilidad de hacerle daño a otra persona.    Esta información no tiene como fin reemplazar el consejo del médico. Asegúrese de hacerle al médico cualquier pregunta que tenga.  Document Released: 06/20/2008 Document Revised: 06/26/2015 Document Reviewed: 07/06/2013  Elsevier Interactive Patient Education © 2017 [employer]vier Inc[employer]

## 2016-05-29 ENCOUNTER — Encounter: Payer: Self-pay | Admitting: Physician Assistant

## 2016-05-29 ENCOUNTER — Ambulatory Visit: Payer: Self-pay | Admitting: Physician Assistant

## 2016-05-29 VITALS — BP 124/78 | HR 82 | Temp 97.7°F | Ht 60.75 in | Wt 155.0 lb

## 2016-05-29 DIAGNOSIS — H5713 Ocular pain, bilateral: Secondary | ICD-10-CM

## 2016-05-29 NOTE — Progress Notes (Signed)
BP 124/78 (BP Location: Left Arm, Patient Position: Sitting, Cuff Size: Normal)   Pulse 82   Temp 97.7 F (36.5 C) (Other (Comment))   Ht 5' 0.75" (1.543 m)   Wt 155 lb (70.3 kg)   SpO2 98%   BMI 29.53 kg/m    Subjective:    Patient ID: Kathryn Salas, female    DOB: 07/13/1965, 51 y.o.   MRN: [id number]  HPI: Kathryn Salas is a 51 y.o. female presenting on 05/29/2016 for Eye Pain (c/o pain to both eyes, primarily the right eye, for one week)   HPI   Chief Complaint  Patient presents with  . Eye Pain    c/o pain to both eyes, primarily the right eye, for one week    Also photophobia.  No change in vision in past week.  Last time she went to eye doctor was about a year ago.   She says they thought she was increased risk for glaucoma and were checking her q 6 months.   Pt does not wear contact lenses.    Relevant past medical, surgical, family and social history reviewed and updated as indicated. Interim medical history since our last visit reviewed. Allergies and medications reviewed and updated.   Current Outpatient Prescriptions:  .  ibuprofen (ADVIL,MOTRIN) 200 MG tablet, Take 400-600 mg by mouth daily as needed for headache or moderate pain., Disp: , Rfl:  .  methocarbamol (ROBAXIN) 500 MG tablet, Take 500 mg by mouth 4 (four) times daily as needed. , Disp: , Rfl:  .  pantoprazole (PROTONIX) 40 MG tablet, 1 po bid.  Tome una tableta por [employer] dos veces diarias, Disp: 60 tablet, Rfl: 3   Review of Systems  Constitutional: Negative for appetite change, chills, diaphoresis, fatigue, fever and unexpected weight change.  HENT: Positive for dental problem. Negative for congestion, drooling, ear pain, facial swelling, hearing loss, mouth sores, sneezing, sore throat, trouble swallowing and voice change.   Eyes: Positive for pain. Negative for discharge, redness, itching and visual disturbance.  Respiratory: Negative for cough, choking, shortness of breath and wheezing.    Cardiovascular: Negative for chest pain, palpitations and leg swelling.  Gastrointestinal: Negative for abdominal pain, blood in stool, constipation, diarrhea and vomiting.  Endocrine: Negative for cold intolerance, heat intolerance and polydipsia.  Genitourinary: Negative for decreased urine volume, dysuria and hematuria.  Musculoskeletal: Positive for arthralgias. Negative for back pain and gait problem.  Skin: Negative for rash.  Allergic/Immunologic: Negative for environmental allergies.  Neurological: Positive for headaches. Negative for seizures, syncope and light-headedness.  Hematological: Negative for adenopathy.  Psychiatric/Behavioral: Negative for agitation, dysphoric mood and suicidal ideas. The patient is not nervous/anxious.     Per HPI unless specifically indicated above     Objective:    BP 124/78 (BP Location: Left Arm, Patient Position: Sitting, Cuff Size: Normal)   Pulse 82   Temp 97.7 F (36.5 C) (Other (Comment))   Ht 5' 0.75" (1.543 m)   Wt 155 lb (70.3 kg)   SpO2 98%   BMI 29.53 kg/m   Wt Readings from Last 3 Encounters:  05/29/16 155 lb (70.3 kg)  04/16/16 150 lb 4 oz (68.2 kg)  02/22/16 148 lb 6.4 oz (67.3 kg)      Physical Exam  Constitutional: She is oriented to person, place, and time. She appears well-developed and well-nourished.  HENT:  Head: Normocephalic and atraumatic.  Eyes: Conjunctivae, EOM and lids are normal. Pupils are equal, round, and reactive to light.  Tetracaine 1gtt OU.  Fluorescein instilled bilaterally.  Exam with woods lamp revealed no defect.  Pulmonary/Chest: Effort normal.  Neurological: She is alert and oriented to person, place, and time.  Skin: Skin is warm and dry.  Psychiatric: She has a normal mood and affect. Her behavior is normal.  Nursing note and vitals reviewed.   Visual acuity checked and noted       Assessment & Plan:    Encounter Diagnosis  Name Primary?  . Pain of both eyes Yes   -Pt urged  to follow up with eye doctors, especially in light of her reported increased risk for glaucoma.  Counseled that she should get seen this week if possible -pt to follow up here as scheduled.  RTO sooner prn worsening or new symptoms

## 2016-05-29 NOTE — Patient Instructions (Addendum)
[facility]  [facility] [address] [place] [address] [phone]  [facility] [address]  [place], [place] [address] [phone]  [facility] [address] [place], [place] [address] [phone]  [facility]  [facility]   [address] [place], [place] [address] [phone]  Beulah GandyJohn D. Ashley RoyaltyMatthews, MD - [facility] [address] [place], [place] [address]  [phone]  [facility] [address] [place], [place] [address] [phone]  [facility] [address] [place], [place] [address]  [phone]

## 2016-06-19 ENCOUNTER — Encounter: Payer: Self-pay | Admitting: Physician Assistant

## 2016-07-16 ENCOUNTER — Ambulatory Visit: Payer: Self-pay | Admitting: Physician Assistant

## 2016-08-22 ENCOUNTER — Ambulatory Visit: Payer: Self-pay | Admitting: Physician Assistant

## 2016-08-22 ENCOUNTER — Encounter: Payer: Self-pay | Admitting: Physician Assistant

## 2016-08-22 VITALS — BP 112/64 | HR 84 | Temp 97.7°F | Ht 60.75 in | Wt 149.0 lb

## 2016-08-22 DIAGNOSIS — R319 Hematuria, unspecified: Secondary | ICD-10-CM

## 2016-08-22 DIAGNOSIS — R102 Pelvic and perineal pain: Secondary | ICD-10-CM

## 2016-08-22 LAB — POCT URINALYSIS DIPSTICK
Glucose, UA: NEGATIVE
LEUKOCYTES UA: NEGATIVE
Nitrite, UA: NEGATIVE
PROTEIN UA: 30
Spec Grav, UA: >=1.03 — AB (ref 1.010–1.025)
Urobilinogen, UA: 0.2 E.U./dL
pH, UA: 5 (ref 5.0–8.0)

## 2016-08-22 MED ORDER — CIPROFLOXACIN HCL 500 MG PO TABS: ORAL_TABLET | ORAL | 0 refills | Status: AC

## 2016-08-22 NOTE — Progress Notes (Signed)
BP 112/64 (BP Location: Left Arm, Patient Position: Sitting, Cuff Size: Normal)   Pulse 84   Temp 97.7 F (36.5 C) (Other (Comment))   Ht 5' 0.75" (1.543 m)   Wt 149 lb (67.6 kg)   SpO2 97%   BMI 28.39 kg/m    Subjective:    Patient ID: Kathryn Salas, female    DOB: 02-19-1966, 51 y.o.   MRN: [id number]  HPI: Kathryn Salas is a 51 y.o. female presenting on 08/22/2016 for Abdominal Pain (states no abd pain today, problems with urination)   HPI   Chief Complaint  Patient presents with  . Abdominal Pain    states no abd pain today, problems with urination     C/o flank pain. No buring.  + urgency.    LMP  3 year ago  Uses some eye drops= she is arranging appt with specialist   Relevant past medical, surgical, family and social history reviewed and updated as indicated. Interim medical history since our last visit reviewed. Allergies and medications reviewed and updated.   Current Outpatient Prescriptions:  .  ibuprofen (ADVIL,MOTRIN) 200 MG tablet, Take 400-600 mg by mouth daily as needed for headache or moderate pain., Disp: , Rfl:  .  methocarbamol (ROBAXIN) 500 MG tablet, Take 500 mg by mouth 4 (four) times daily as needed. , Disp: , Rfl:  .  pantoprazole (PROTONIX) 40 MG tablet, 1 po bid.  Tome una tableta por boca dos veces diarias, Disp: 60 tablet, Rfl: 3  . Review of Systems  Constitutional: Negative for appetite change, chills, diaphoresis, fatigue, fever and unexpected weight change.  HENT: Positive for dental problem. Negative for congestion, drooling, ear pain, facial swelling, hearing loss, mouth sores, sneezing, sore throat, trouble swallowing and voice change.   Eyes: Negative for pain, discharge, redness, itching and visual disturbance.  Respiratory: Negative for cough, choking, shortness of breath and wheezing.   Cardiovascular: Negative for chest pain, palpitations and leg swelling.  Gastrointestinal: Negative for abdominal pain, blood in stool,  constipation, diarrhea and vomiting.  Endocrine: Negative for cold intolerance, heat intolerance and polydipsia.  Genitourinary: Negative for decreased urine volume, dysuria and hematuria.  Musculoskeletal: Positive for arthralgias. Negative for back pain and gait problem.  Skin: Negative for rash.  Allergic/Immunologic: Negative for environmental allergies.  Neurological: Positive for headaches. Negative for seizures, syncope and light-headedness.  Hematological: Negative for adenopathy.  Psychiatric/Behavioral: Negative for agitation, dysphoric mood and suicidal ideas. The patient is not nervous/anxious.     Per HPI unless specifically indicated above     Objective:    BP 112/64 (BP Location: Left Arm, Patient Position: Sitting, Cuff Size: Normal)   Pulse 84   Temp 97.7 F (36.5 C) (Other (Comment))   Ht 5' 0.75" (1.543 m)   Wt 149 lb (67.6 kg)   SpO2 97%   BMI 28.39 kg/m   Wt Readings from Last 3 Encounters:  08/22/16 149 lb (67.6 kg)  05/29/16 155 lb (70.3 kg)  04/16/16 150 lb 4 oz (68.2 kg)    Physical Exam  Constitutional: She is oriented to person, place, and time. She appears well-developed and well-nourished.  HENT:  Head: Normocephalic and atraumatic.  Neck: Neck supple.  Cardiovascular: Normal rate and regular rhythm.   Pulmonary/Chest: Effort normal and breath sounds normal.  Abdominal: Soft. Bowel sounds are normal. She exhibits no mass. There is no hepatosplenomegaly. There is no tenderness. There is no CVA tenderness.  Musculoskeletal: She exhibits no edema.  Lymphadenopathy:  She has no cervical adenopathy.  Neurological: She is alert and oriented to person, place, and time.  Skin: Skin is warm and dry.  Psychiatric: She has a normal mood and affect. Her behavior is normal.  Vitals reviewed.  UA + blood     Assessment & Plan:   Encounter Diagnoses  Name Primary?  . Suprapubic pain Yes  . Hematuria, unspecified type    -rx cipro -pt to follow  up as scheduled (next week for PAP)

## 2016-08-27 ENCOUNTER — Ambulatory Visit: Payer: Self-pay | Admitting: Physician Assistant

## 2016-09-03 ENCOUNTER — Ambulatory Visit: Payer: Self-pay | Admitting: Physician Assistant

## 2016-09-04 ENCOUNTER — Other Ambulatory Visit (HOSPITAL_COMMUNITY)
Admission: RE | Admit: 2016-09-04 | Discharge: 2016-09-04 | Disposition: A | Discharge status: Home / Self Care | Payer: Self-pay | Source: Ambulatory Visit | Attending: Physician Assistant | Admitting: Physician Assistant

## 2016-09-04 ENCOUNTER — Encounter: Payer: Self-pay | Admitting: Physician Assistant

## 2016-09-04 ENCOUNTER — Ambulatory Visit: Payer: Self-pay | Admitting: Physician Assistant

## 2016-09-04 VITALS — BP 110/60 | HR 77 | Temp 97.9°F | Ht 60.75 in | Wt 151.2 lb

## 2016-09-04 DIAGNOSIS — Z01419 Encounter for gynecological examination (general) (routine) without abnormal findings: Secondary | ICD-10-CM | POA: Insufficient documentation

## 2016-09-04 DIAGNOSIS — Z124 Encounter for screening for malignant neoplasm of cervix: Secondary | ICD-10-CM

## 2016-09-04 NOTE — Progress Notes (Signed)
BP 110/60 (BP Location: Left Arm, Patient Position: Sitting, Cuff Size: Normal)   Pulse 77   Temp 97.9 F (36.6 C)   Ht 5' 0.75" (1.543 m)   Wt 151 lb 4 oz (68.6 kg)   LMP 03/19/2003 (Approximate)   SpO2 99%   BMI 28.81 kg/m    Subjective:    Patient ID: Kathryn FillerAnahi Paternostro, female    DOB: 11/18/1965, 51 y.o.   MRN: [id number]  HPI: Kathryn Salas is a 51 y.o. female presenting on 09/04/2016 for Gynecologic Exam (pt states she has hemorrhoids. pt states bleeding at times. pt states she takes laxatives)   HPI  Pt here for PAP today  Relevant past medical, surgical, family and social history reviewed and updated as indicated. Interim medical history since our last visit reviewed. Allergies and medications reviewed and updated.   Current Outpatient Prescriptions:  .  ibuprofen (ADVIL,MOTRIN) 200 MG tablet, Take 400-600 mg by mouth daily as needed for headache or moderate pain., Disp: , Rfl:  .  pantoprazole (PROTONIX) 40 MG tablet, 1 po bid.  Tome una tableta por [employer] dos veces diarias, Disp: 60 tablet, Rfl: 3 .  methocarbamol (ROBAXIN) 500 MG tablet, Take 500 mg by mouth 4 (four) times daily as needed. , Disp: , Rfl:    Review of Systems  Constitutional: Negative for appetite change, chills, diaphoresis, fatigue, fever and unexpected weight change.  HENT: Negative for congestion, drooling, ear pain, facial swelling, hearing loss, mouth sores, sneezing, sore throat, trouble swallowing and voice change.   Eyes: Negative for pain, discharge, redness, itching and visual disturbance.  Respiratory: Negative for cough, choking, shortness of breath and wheezing.   Cardiovascular: Negative for chest pain, palpitations and leg swelling.  Gastrointestinal: Negative for abdominal pain, blood in stool, constipation, diarrhea and vomiting.  Endocrine: Negative for cold intolerance, heat intolerance and polydipsia.  Genitourinary: Negative for decreased urine volume, dysuria and hematuria.   Musculoskeletal: Positive for arthralgias. Negative for back pain and gait problem.  Skin: Negative for rash.  Allergic/Immunologic: Negative for environmental allergies.  Neurological: Negative for seizures, syncope, light-headedness and headaches.  Hematological: Negative for adenopathy.  Psychiatric/Behavioral: Negative for agitation, dysphoric mood and suicidal ideas. The patient is not nervous/anxious.     Per HPI unless specifically indicated above     Objective:    BP 110/60 (BP Location: Left Arm, Patient Position: Sitting, Cuff Size: Normal)   Pulse 77   Temp 97.9 F (36.6 C)   Ht 5' 0.75" (1.543 m)   Wt 151 lb 4 oz (68.6 kg)   LMP 03/19/2003 (Approximate)   SpO2 99%   BMI 28.81 kg/m   Wt Readings from Last 3 Encounters:  09/04/16 151 lb 4 oz (68.6 kg)  08/22/16 149 lb (67.6 kg)  05/29/16 155 lb (70.3 kg)    Physical Exam  Constitutional: She is oriented to person, place, and time. She appears well-developed and well-nourished.  Pulmonary/Chest: Effort normal.  Breast exam normal  Abdominal: Soft. She exhibits no mass. There is no tenderness. There is no rebound and no guarding.  Genitourinary: Vagina normal and uterus normal. No breast swelling, tenderness, discharge or bleeding. There is no rash, tenderness or lesion on the right labia. There is no rash, tenderness or lesion on the left labia. Cervix exhibits no motion tenderness, no discharge and no friability. Right adnexum displays no mass, no tenderness and no fullness. Left adnexum displays no mass, no tenderness and no fullness.  Genitourinary Comments: (nurse Berenice assisted)  Neurological: She is alert and oriented to person, place, and time.  Skin: Skin is warm and dry.  Psychiatric: Her behavior is normal. Her mood appears anxious.  Nursing note and vitals reviewed.       Assessment & Plan:   Encounter Diagnosis  Name Primary?  . Routine Papanicolaou smear Yes     -no changes in medication  today -pt to follow up in 4 months. RTO sooner prn

## 2016-09-26 ENCOUNTER — Encounter: Payer: Self-pay | Admitting: Physician Assistant

## 2016-10-01 ENCOUNTER — Encounter: Payer: Self-pay | Admitting: Physician Assistant

## 2016-10-01 NOTE — Progress Notes (Signed)
Pt came by the office c/o mouth sores. Pt states she has used bicarbonate and orajel and has had no improvements.  Pt was recommended to use orajel, rinse mouth with warm salt water, and is to avoid eating acidic food. Pt was informed that pt is to let mouth sores run its course. Pt verbalized understanding.

## 2017-01-01 ENCOUNTER — Ambulatory Visit: Payer: Self-pay | Admitting: Physician Assistant

## 2017-01-07 ENCOUNTER — Ambulatory Visit: Payer: Self-pay | Admitting: Physician Assistant

## 2017-01-07 ENCOUNTER — Encounter: Payer: Self-pay | Admitting: Physician Assistant

## 2017-01-07 VITALS — BP 106/64 | HR 70 | Temp 97.9°F | Ht 60.75 in | Wt 147.8 lb

## 2017-01-07 DIAGNOSIS — K219 Gastro-esophageal reflux disease without esophagitis: Secondary | ICD-10-CM

## 2017-01-07 DIAGNOSIS — G8929 Other chronic pain: Secondary | ICD-10-CM

## 2017-01-07 DIAGNOSIS — Z1239 Encounter for other screening for malignant neoplasm of breast: Secondary | ICD-10-CM

## 2017-01-07 DIAGNOSIS — R51 Headache: Secondary | ICD-10-CM

## 2017-01-07 NOTE — Progress Notes (Signed)
BP 106/64 (BP Location: Left Arm, Patient Position: Sitting, Cuff Size: Normal)   Pulse 70   Temp 97.9 F (36.6 C)   Ht 5' 0.75" (1.543 m)   Wt 147 lb 12 oz (67 kg)   LMP 03/19/2003 (Approximate)   SpO2 98%   BMI 28.15 kg/m    Subjective:    Patient ID: Kathryn Salas, female    DOB: 06/26/1965, 51 y.o.   MRN: [id number]  HPI: Kathryn Salas is a 51 y.o. female presenting on 01/07/2017 for Follow-up and Headache (pt states everyday pt states she alternates with ibu or naproxen pt states it calms the HA by does not make it go away away)   HPI   Chief Complaint  Patient presents with  . Follow-up  . Headache    pt states everyday pt states she alternates with ibu or naproxen pt states it calms the HA by does not make it go away away    Her GERD is doing well and she only uses the ppi prn because she now watches what she eats.  Pt has had HA problems for years, her whole life.  She has seen specialists prior to moving here.  She says it's worse now because more foods make her head hurt more.   She says she went to [facility] doctor since moving here.   She says she has follow-up there next month.  They are suspect glaucoma.  She says they told her that her HA were not due to her eyes.   She does not remember the name of her neurologist in [place] that she saw in the past.  She says that they did lots of testing and studies and imaging and didn't find anything.    Relevant past medical, surgical, family and social history reviewed and updated as indicated. Interim medical history since our last visit reviewed. Allergies and medications reviewed and updated.   Current Outpatient Prescriptions:  .  ibuprofen (ADVIL,MOTRIN) 200 MG tablet, Take 400-600 mg by mouth daily. , Disp: , Rfl:  .  pantoprazole (PROTONIX) 40 MG tablet, 1 po bid.  Tome una tableta por [employer] veces diarias, Disp: 60 tablet, Rfl: 3 .  methocarbamol (ROBAXIN) 500 MG tablet, Take 500 mg by mouth 4 (four) times daily as  needed. , Disp: , Rfl:    Review of Systems  Constitutional: Negative for appetite change, chills, diaphoresis, fatigue, fever and unexpected weight change.  HENT: Positive for dental problem. Negative for congestion, drooling, ear pain, facial swelling, hearing loss, mouth sores, sneezing, sore throat, trouble swallowing and voice change.   Eyes: Positive for redness. Negative for pain, discharge, itching and visual disturbance.  Respiratory: Negative for cough, choking, shortness of breath and wheezing.   Cardiovascular: Negative for chest pain, palpitations and leg swelling.  Gastrointestinal: Negative for abdominal pain, blood in stool, constipation, diarrhea and vomiting.  Endocrine: Negative for cold intolerance, heat intolerance and polydipsia.  Genitourinary: Negative for decreased urine volume, dysuria and hematuria.  Musculoskeletal: Positive for back pain. Negative for arthralgias and gait problem.  Skin: Negative for rash.  Allergic/Immunologic: Negative for environmental allergies.  Neurological: Positive for headaches. Negative for seizures, syncope and light-headedness.  Hematological: Negative for adenopathy.  Psychiatric/Behavioral: Negative for agitation, dysphoric mood and suicidal ideas. The patient is not nervous/anxious.     Per HPI unless specifically indicated above     Objective:    BP 106/64 (BP Location: Left Arm, Patient Position: Sitting, Cuff Size: Normal)   Pulse  70   Temp 97.9 F (36.6 C)   Ht 5' 0.75" (1.543 m)   Wt 147 lb 12 oz (67 kg)   LMP 03/19/2003 (Approximate)   SpO2 98%   BMI 28.15 kg/m   Wt Readings from Last 3 Encounters:  01/07/17 147 lb 12 oz (67 kg)  09/04/16 151 lb 4 oz (68.6 kg)  08/22/16 149 lb (67.6 kg)    Physical Exam  Constitutional: She is oriented to person, place, and time. She appears well-developed and well-nourished.  HENT:  Head: Normocephalic and atraumatic.  Neck: Neck supple.  Cardiovascular: Normal rate and  regular rhythm.   Pulmonary/Chest: Effort normal and breath sounds normal.  Abdominal: Soft. Bowel sounds are normal. She exhibits no mass. There is no hepatosplenomegaly. There is no tenderness.  Musculoskeletal: She exhibits no edema.  Lymphadenopathy:    She has no cervical adenopathy.  Neurological: She is alert and oriented to person, place, and time. She has normal strength. She displays no tremor. No cranial nerve deficit or sensory deficit. She exhibits normal muscle tone. Coordination and gait normal.  Reflex Scores:      Patellar reflexes are 2+ on the right side and 2+ on the left side. Skin: Skin is warm and dry.  Psychiatric: She has a normal mood and affect. Her behavior is normal.  Vitals reviewed.       Assessment & Plan:   Encounter Diagnoses  Name Primary?  . Gastroesophageal reflux disease, esophagitis presence not specified Yes  . Chronic nonintractable headache, unspecified headache type   . Screening for breast cancer      -Discussed with pt a referral to neurologist but pt agrees that likely nothing would be found because she says she has had the whole work-up before so unlikely that referral would be fruitful.  Pt agrees. -recommended that pt Keep diary of headaches and also her foods since she thinks that foods are triggering her headaches.  review the information to see if she can locate any correlations -continue ppi prn -will order mammogram for December -pt to follow up 6 months.  RTO sooner prn changes or problems

## 2017-03-03 ENCOUNTER — Ambulatory Visit: Payer: Self-pay | Admitting: Physician Assistant

## 2017-03-03 ENCOUNTER — Encounter: Payer: Self-pay | Admitting: Physician Assistant

## 2017-03-03 ENCOUNTER — Other Ambulatory Visit (HOSPITAL_COMMUNITY)
Admission: RE | Admit: 2017-03-03 | Discharge: 2017-03-03 | Disposition: A | Discharge status: Home / Self Care | Payer: Self-pay | Source: Ambulatory Visit | Attending: Physician Assistant | Admitting: Physician Assistant

## 2017-03-03 VITALS — BP 136/74 | HR 78 | Temp 98.1°F | Wt 150.2 lb

## 2017-03-03 DIAGNOSIS — M2550 Pain in unspecified joint: Secondary | ICD-10-CM

## 2017-03-03 LAB — SEDIMENTATION RATE: Sed Rate: 35 mm/hr — ABNORMAL HIGH (ref 0–22)

## 2017-03-03 MED ORDER — DICLOFENAC SODIUM 75 MG PO TBEC: DELAYED_RELEASE_TABLET | ORAL | 1 refills | Status: DC

## 2017-03-03 NOTE — Progress Notes (Signed)
BP 136/74 (BP Location: Left Arm, Patient Position: Sitting, Cuff Size: Normal)   Pulse 78   Temp 98.1 F (36.7 C)   Wt 150 lb 4 oz (68.2 kg)   LMP 03/19/2003 (Approximate)   SpO2 98%   BMI 28.62 kg/m    Subjective:    Patient ID: Kathryn Salas, female    DOB: 17-Jun-1965, 51 y.o.   MRN: [id number]  HPI: Kathryn Salas is a 51 y.o. female presenting on 03/03/2017 for Pain (all over)   HPI   Pt C/o pain- Shoulders, back, hips, hand, neck, knees. It Started 4-5 days ago.  She says it keeps her from sleeping.   Her ears feel stuffed up.  She denies having a cold/URI.   She has used tylenol and ibuprofen as well has creams.  She says it has not helped any.   She says she had the same thing last month.  It lasted for about 5-6 days and then went away.  She says last month was not the first time it had happened.  She feels pain often due to arthritis but it was the first time her ears feels stopped up.  Sometimes the ears feeling goes away.   Relevant past medical, surgical, family and social history reviewed and updated as indicated. Interim medical history since our last visit reviewed. Allergies and medications reviewed and updated.   CURRENT MEDS: IBU APAP  Review of Systems  Constitutional: Negative for appetite change, chills, diaphoresis, fatigue, fever and unexpected weight change.  HENT: Positive for dental problem. Negative for congestion, drooling, ear pain, facial swelling, hearing loss, mouth sores, sneezing, sore throat, trouble swallowing and voice change.   Eyes: Negative for pain, discharge, redness, itching and visual disturbance.  Respiratory: Negative for cough, choking, shortness of breath and wheezing.   Cardiovascular: Negative for chest pain, palpitations and leg swelling.  Gastrointestinal: Negative for abdominal pain, blood in stool, constipation, diarrhea and vomiting.  Endocrine: Negative for cold intolerance, heat intolerance and polydipsia.   Genitourinary: Negative for decreased urine volume, dysuria and hematuria.  Musculoskeletal: Positive for arthralgias and back pain. Negative for gait problem.  Skin: Negative for rash.  Allergic/Immunologic: Negative for environmental allergies.  Neurological: Negative for seizures, syncope, light-headedness and headaches.  Hematological: Negative for adenopathy.  Psychiatric/Behavioral: Negative for agitation, dysphoric mood and suicidal ideas. The patient is not nervous/anxious.     Per HPI unless specifically indicated above     Objective:    BP 136/74 (BP Location: Left Arm, Patient Position: Sitting, Cuff Size: Normal)   Pulse 78   Temp 98.1 F (36.7 C)   Wt 150 lb 4 oz (68.2 kg)   LMP 03/19/2003 (Approximate)   SpO2 98%   BMI 28.62 kg/m   Wt Readings from Last 3 Encounters:  03/03/17 150 lb 4 oz (68.2 kg)  01/07/17 147 lb 12 oz (67 kg)  09/04/16 151 lb 4 oz (68.6 kg)    Physical Exam  Constitutional: She is oriented to person, place, and time. She appears well-developed and well-nourished.  HENT:  Head: Normocephalic and atraumatic.  Right Ear: Hearing, tympanic membrane, external ear and ear canal normal.  Left Ear: Hearing, tympanic membrane, external ear and ear canal normal.  Nose: Nose normal.  Mouth/Throat: Uvula is midline and oropharynx is clear and moist. No oropharyngeal exudate.  Neck: Neck supple.  Cardiovascular: Normal rate and regular rhythm.  Pulmonary/Chest: Effort normal and breath sounds normal. She has no wheezes.  Abdominal: Soft. Bowel sounds are  normal. She exhibits no mass. There is no hepatosplenomegaly. There is no tenderness.  Musculoskeletal: She exhibits no edema.  Diffuse mild tenderness. No joint swelling seen. No redness.  No deformity.   Lymphadenopathy:    She has no cervical adenopathy.  Neurological: She is alert and oriented to person, place, and time.  Skin: Skin is warm and dry.  Psychiatric: She has a normal mood and  affect. Her behavior is normal.  Vitals reviewed.       Assessment & Plan:    Encounter Diagnosis  Name Primary?  Marland Kitchen Arthralgia, unspecified joint Yes     -Check esr, RF -rx diclofenac -pt to follow up as scheduled. RTO sooner prn

## 2017-03-04 LAB — RHEUMATOID FACTOR: Rhuematoid fact SerPl-aCnc: <10 IU/mL (ref 0.0–13.9)

## 2017-04-17 ENCOUNTER — Encounter: Payer: Self-pay | Admitting: Physician Assistant

## 2017-04-17 ENCOUNTER — Ambulatory Visit: Payer: Self-pay | Admitting: Physician Assistant

## 2017-04-17 VITALS — BP 142/80 | HR 80 | Temp 97.3°F | Ht 60.75 in | Wt 150.0 lb

## 2017-04-17 DIAGNOSIS — F419 Anxiety disorder, unspecified: Secondary | ICD-10-CM

## 2017-04-17 DIAGNOSIS — H938X3 Other specified disorders of ear, bilateral: Secondary | ICD-10-CM

## 2017-04-17 DIAGNOSIS — R5383 Other fatigue: Secondary | ICD-10-CM

## 2017-04-17 MED ORDER — PREDNISONE 10 MG PO TABS: ORAL_TABLET | ORAL | 0 refills | Status: DC

## 2017-04-17 NOTE — Progress Notes (Signed)
BP (!) 142/80 (BP Location: Left Arm, Patient Position: Sitting, Cuff Size: Normal)   Pulse 80   Temp (!) 97.3 F (36.3 C)   Ht 5' 0.75" (1.543 m)   Wt 150 lb (68 kg)   LMP 03/19/2003 (Approximate)   SpO2 99%   BMI 28.58 kg/m    Subjective:    Patient ID: Kathryn Salas, female    DOB: 07/08/1965, 52 y.o.   MRN: [id number]  HPI: Kathryn Salas is a 52 y.o. female presenting on 04/17/2017 for Ear Fullness (pt states feels pressure and not pain bilaterally. pt states worse on left ear. pt states she also feels pressure on her chest as if she were having an asthma attack.)   HPI   Chief Complaint  Patient presents with  . Ear Fullness    pt states feels pressure and not pain bilaterally. pt states worse on left ear. pt states she also feels pressure on her chest as if she were having an asthma attack.   symtpoms x 2 week. No cough.  No drainage.  + fatigue.  Sunday she felt like she might have a fever but she didn't check it.    Pt's daughter has been diagnosed with cystic fibrosis.  She just found out yesterday.  Her daughter is pregnant.   Pt asks if she can get genetic testing to see if she has CF gene.   Pt says she is very anxious all the time.   Relevant past medical, surgical, family and social history reviewed and updated as indicated. Interim medical history since our last visit reviewed. Allergies and medications reviewed and updated.   Current Outpatient Medications:  .  diclofenac (VOLTAREN) 75 MG EC tablet, 1 po bid prn pain.   Tome una tableta por boca dos veces diarias cuando necesario para [employer] dolor, Disp: 30 tablet, Rfl: 1 .  ibuprofen (ADVIL,MOTRIN) 200 MG tablet, Take 400-600 mg by mouth daily. , Disp: , Rfl:  .  pantoprazole (PROTONIX) 40 MG tablet, 1 po bid.  Tome una tableta por [employer] dos veces diarias (Patient taking differently: Take 40 mg by mouth as needed. 1 po bid.  Tome una tableta por boca dos veces diarias), Disp: 60 tablet, Rfl: 3 .  methocarbamol  (ROBAXIN) 500 MG tablet, Take 500 mg by mouth 4 (four) times daily as needed. , Disp: , Rfl:    Review of Systems  Constitutional: Negative for appetite change, chills, diaphoresis, fatigue, fever and unexpected weight change.  HENT: Positive for dental problem. Negative for congestion, drooling, ear pain, facial swelling, hearing loss, mouth sores, sneezing, sore throat, trouble swallowing and voice change.   Eyes: Negative for pain, discharge, redness, itching and visual disturbance.  Respiratory: Negative for cough, choking, shortness of breath and wheezing.   Cardiovascular: Negative for chest pain, palpitations and leg swelling.  Gastrointestinal: Negative for abdominal pain, blood in stool, constipation, diarrhea and vomiting.  Endocrine: Negative for cold intolerance, heat intolerance and polydipsia.  Genitourinary: Negative for decreased urine volume, dysuria and hematuria.  Musculoskeletal: Positive for arthralgias and back pain. Negative for gait problem.  Skin: Negative for rash.  Allergic/Immunologic: Negative for environmental allergies.  Neurological: Negative for seizures, syncope, light-headedness and headaches.  Hematological: Negative for adenopathy.  Psychiatric/Behavioral: Negative for agitation, dysphoric mood and suicidal ideas. The patient is not nervous/anxious.     Per HPI unless specifically indicated above     Objective:    BP (!) 142/80 (BP Location: Left Arm, Patient Position: Sitting, Cuff  Size: Normal)   Pulse 80   Temp (!) 97.3 F (36.3 C)   Ht 5' 0.75" (1.543 m)   Wt 150 lb (68 kg)   LMP 03/19/2003 (Approximate)   SpO2 99%   BMI 28.58 kg/m   Wt Readings from Last 3 Encounters:  04/17/17 150 lb (68 kg)  03/03/17 150 lb 4 oz (68.2 kg)  01/07/17 147 lb 12 oz (67 kg)    Physical Exam  Constitutional: She is oriented to person, place, and time. She appears well-developed and well-nourished.  HENT:  Head: Normocephalic and atraumatic.  Right Ear:  Hearing, tympanic membrane, external ear and ear canal normal.  Left Ear: Hearing, tympanic membrane, external ear and ear canal normal.  Nose: Nose normal.  Mouth/Throat: Uvula is midline and oropharynx is clear and moist. No oropharyngeal exudate.  Neck: Neck supple.  Cardiovascular: Normal rate and regular rhythm.  Pulmonary/Chest: Effort normal and breath sounds normal. She has no wheezes.  Abdominal: Soft. Bowel sounds are normal. She exhibits no mass. There is no hepatosplenomegaly. There is no tenderness.  Musculoskeletal: She exhibits no edema.  Lymphadenopathy:    She has no cervical adenopathy.  Neurological: She is alert and oriented to person, place, and time.  Skin: Skin is warm and dry.  Psychiatric: She has a normal mood and affect. Her behavior is normal.  Vitals reviewed.       Assessment & Plan:   Encounter Diagnoses  Name Primary?  . Fatigue, unspecified type Yes  . Anxiety   . Pressure sensation in both ears      -Offered inhaler but she tried one in the past and it made her too jittery.   Discussed short course prednisone and she says that would be good.  She was on it in the past and did well with it.  -discussed possible medicine for her anxiety and she says she will think about it -discussed with pt that genetic testing would be unproductive and wasteful of resources.  Her daughter is already getting genetic testing and the pt is not going to have any more children.  She states understanding -pt to follow up as scheduled.  RTO sooner prn

## 2017-07-09 ENCOUNTER — Ambulatory Visit: Payer: Self-pay | Admitting: Physician Assistant

## 2017-07-14 ENCOUNTER — Encounter: Payer: Self-pay | Admitting: Physician Assistant

## 2017-07-14 ENCOUNTER — Ambulatory Visit: Payer: Self-pay | Admitting: Physician Assistant

## 2017-07-14 VITALS — BP 120/70 | HR 75 | Temp 97.3°F | Ht 60.75 in | Wt 149.0 lb

## 2017-07-14 DIAGNOSIS — Z1239 Encounter for other screening for malignant neoplasm of breast: Secondary | ICD-10-CM

## 2017-07-14 DIAGNOSIS — Z860100 Personal history of colon polyps, unspecified: Secondary | ICD-10-CM

## 2017-07-14 DIAGNOSIS — M2550 Pain in unspecified joint: Secondary | ICD-10-CM

## 2017-07-14 DIAGNOSIS — T148XXA Other injury of unspecified body region, initial encounter: Secondary | ICD-10-CM

## 2017-07-14 DIAGNOSIS — R5383 Other fatigue: Secondary | ICD-10-CM

## 2017-07-14 DIAGNOSIS — Z13220 Encounter for screening for lipoid disorders: Secondary | ICD-10-CM

## 2017-07-14 MED ORDER — GABAPENTIN 300 MG PO CAPS: 300.0000 mg | ORAL_CAPSULE | Freq: Three times a day (TID) | ORAL | 3 refills | Status: DC

## 2017-07-14 NOTE — Progress Notes (Signed)
BP 120/70 (BP Location: Left Arm, Patient Position: Sitting, Cuff Size: Normal)   Pulse 75   Temp (!) 97.3 F (36.3 C)   Ht 5' 0.75" (1.543 m)   Wt 149 lb (67.6 kg)   LMP 03/19/2003 (Approximate)   SpO2 99%   BMI 28.39 kg/m    Subjective:    Patient ID: Kathryn Salas, female    DOB: [birth year], 52 y.o.   MRN: [id number]  HPI: Kathryn Salas is a 52 y.o. female presenting on 07/14/2017 for Follow-up   HPI   Pt had a polyp found on  colonoscopy 07/10/12 with recomendation to repeat in 5 year.  Report under media tab in [facility].   Pt complains of numbness L arm and hand and complains of bruising all over  Blood tests normal in 2017  Pt fell 2 or 3 months ago and says that her arm is now numb .  She doesn't know that she fell on the arm.  She says she fell on her bottom.    Pt is a poor historian.   Pt with a great deal of anxiety.  Pt with regularly imagined ill-health.    Relevant past medical, surgical, family and social history reviewed and updated as indicated. Interim medical history since our last visit reviewed. Allergies and medications reviewed and updated.   Current Outpatient Medications:  .  ibuprofen (ADVIL,MOTRIN) 200 MG tablet, Take 400-600 mg by mouth daily. , Disp: , Rfl:    Review of Systems  Constitutional: Negative for appetite change, chills, diaphoresis, fatigue, fever and unexpected weight change.  HENT: Positive for dental problem. Negative for congestion, drooling, ear pain, facial swelling, hearing loss, mouth sores, sneezing, sore throat, trouble swallowing and voice change.   Eyes: Negative for pain, discharge, redness, itching and visual disturbance.  Respiratory: Negative for cough, choking, shortness of breath and wheezing.   Cardiovascular: Negative for chest pain, palpitations and leg swelling.  Gastrointestinal: Negative for abdominal pain, blood in stool, constipation, diarrhea and vomiting.  Endocrine: Negative for cold intolerance, heat  intolerance and polydipsia.  Genitourinary: Negative for decreased urine volume, dysuria and hematuria.  Musculoskeletal: Positive for arthralgias and back pain. Negative for gait problem.  Skin: Negative for rash.  Allergic/Immunologic: Negative for environmental allergies.  Neurological: Positive for headaches. Negative for seizures, syncope and light-headedness.  Hematological: Negative for adenopathy.  Psychiatric/Behavioral: Negative for agitation, dysphoric mood and suicidal ideas. The patient is not nervous/anxious.     Per HPI unless specifically indicated above     Objective:    BP 120/70 (BP Location: Left Arm, Patient Position: Sitting, Cuff Size: Normal)   Pulse 75   Temp (!) 97.3 F (36.3 C)   Ht 5' 0.75" (1.543 m)   Wt 149 lb (67.6 kg)   LMP 03/19/2003 (Approximate)   SpO2 99%   BMI 28.39 kg/m   Wt Readings from Last 3 Encounters:  07/14/17 149 lb (67.6 kg)  04/17/17 150 lb (68 kg)  03/03/17 150 lb 4 oz (68.2 kg)    Physical Exam  Constitutional: She is oriented to person, place, and time. She appears well-developed and well-nourished.  HENT:  Head: Normocephalic and atraumatic.  Neck: Neck supple.  Cardiovascular: Normal rate and regular rhythm.  Pulmonary/Chest: Effort normal and breath sounds normal.  Abdominal: Soft. Bowel sounds are normal. She exhibits no mass. There is no hepatosplenomegaly. There is no tenderness.  Musculoskeletal: She exhibits no edema.       Left upper arm: She exhibits tenderness.  She exhibits no bony tenderness, no swelling, no edema, no deformity and no laceration.  Mild non-point tenderness LUE.  No swelling or tender bruised areas.   Lymphadenopathy:    She has no cervical adenopathy.  Neurological: She is alert and oriented to person, place, and time.  Skin: Skin is warm and dry. Bruising noted.  Several scattered bruises or various sizes.  They are in various stages of resolution but all appear to be healing normally.  None  appear abnormally large or abnormal in other ways.  No petechiae.   Psychiatric: She has a normal mood and affect. Her behavior is normal.  Vitals reviewed.       Assessment & Plan:    Encounter Diagnoses  Name Primary?  Marland Kitchen Arthralgia, unspecified joint Yes  . Fatigue, unspecified type   . Screening cholesterol level   . Bruising   . History of colonic polyps   . Screening for breast cancer      -Refer to GI for colonoscopy -[facility] -will Recheck labs -rx Gabapentin since pt insists that OTCs not helping her any -ordered Screening mammogram -pt to follow up 6 wk. RTO sooner prn

## 2017-07-15 ENCOUNTER — Other Ambulatory Visit: Payer: Self-pay | Admitting: Physician Assistant

## 2017-07-15 DIAGNOSIS — Z1231 Encounter for screening mammogram for malignant neoplasm of breast: Secondary | ICD-10-CM

## 2017-07-17 ENCOUNTER — Other Ambulatory Visit (HOSPITAL_COMMUNITY)
Admission: RE | Admit: 2017-07-17 | Discharge: 2017-07-17 | Disposition: A | Discharge status: Home / Self Care | Payer: Self-pay | Source: Ambulatory Visit | Attending: Physician Assistant | Admitting: Physician Assistant

## 2017-07-17 DIAGNOSIS — Z13220 Encounter for screening for lipoid disorders: Secondary | ICD-10-CM | POA: Insufficient documentation

## 2017-07-17 DIAGNOSIS — R5383 Other fatigue: Secondary | ICD-10-CM | POA: Insufficient documentation

## 2017-07-17 DIAGNOSIS — T148XXA Other injury of unspecified body region, initial encounter: Secondary | ICD-10-CM | POA: Insufficient documentation

## 2017-07-17 DIAGNOSIS — X58XXXA Exposure to other specified factors, initial encounter: Secondary | ICD-10-CM | POA: Insufficient documentation

## 2017-07-17 DIAGNOSIS — M2550 Pain in unspecified joint: Secondary | ICD-10-CM | POA: Insufficient documentation

## 2017-07-17 LAB — COMPREHENSIVE METABOLIC PANEL
ALK PHOS: 73 U/L (ref 38–126)
ALT: 21 U/L (ref 14–54)
AST: 27 U/L (ref 15–41)
Albumin: 4.5 g/dL (ref 3.5–5.0)
Anion gap: 13 (ref 5–15)
BUN: 19 mg/dL (ref 6–20)
CALCIUM: 9.8 mg/dL (ref 8.9–10.3)
CO2: 23 mmol/L (ref 22–32)
CREATININE: 0.75 mg/dL (ref 0.44–1.00)
Chloride: 104 mmol/L (ref 101–111)
Glucose, Bld: 88 mg/dL (ref 65–99)
Potassium: 4 mmol/L (ref 3.5–5.1)
SODIUM: 140 mmol/L (ref 135–145)
Total Bilirubin: 0.8 mg/dL (ref 0.3–1.2)
Total Protein: 7.9 g/dL (ref 6.5–8.1)

## 2017-07-17 LAB — CBC
HCT: 40.7 % (ref 36.0–46.0)
Hemoglobin: 13.2 g/dL (ref 12.0–15.0)
MCH: 28.3 pg (ref 26.0–34.0)
MCHC: 32.4 g/dL (ref 30.0–36.0)
MCV: 87.3 fL (ref 78.0–100.0)
PLATELETS: 199 10*3/uL (ref 150–400)
RBC: 4.66 MIL/uL (ref 3.87–5.11)
RDW: 12.2 % (ref 11.5–15.5)
WBC: 5.8 10*3/uL (ref 4.0–10.5)

## 2017-07-17 LAB — LIPID PANEL
Cholesterol: 194 mg/dL (ref 0–200)
HDL: 67 mg/dL (ref >40)
LDL Cholesterol: 117 mg/dL — ABNORMAL HIGH (ref 0–99)
TRIGLYCERIDES: 48 mg/dL (ref <150)
Total CHOL/HDL Ratio: 2.9 RATIO
VLDL: 10 mg/dL (ref 0–40)

## 2017-07-17 LAB — PROTIME-INR
INR: 0.94
Prothrombin Time: 12.5 seconds (ref 11.4–15.2)

## 2017-08-25 ENCOUNTER — Ambulatory Visit
Admission: RE | Admit: 2017-08-25 | Discharge: 2017-08-25 | Disposition: A | Discharge status: Home / Self Care | Payer: No Typology Code available for payment source | Source: Ambulatory Visit | Attending: Physician Assistant | Admitting: Physician Assistant

## 2017-08-25 ENCOUNTER — Ambulatory Visit: Payer: Self-pay | Admitting: Physician Assistant

## 2017-08-25 ENCOUNTER — Encounter: Payer: Self-pay | Admitting: Physician Assistant

## 2017-08-25 VITALS — BP 116/60 | HR 74 | Temp 97.9°F | Ht 60.75 in | Wt 148.5 lb

## 2017-08-25 DIAGNOSIS — Z1231 Encounter for screening mammogram for malignant neoplasm of breast: Secondary | ICD-10-CM

## 2017-08-25 DIAGNOSIS — R5383 Other fatigue: Secondary | ICD-10-CM

## 2017-08-25 DIAGNOSIS — E785 Hyperlipidemia, unspecified: Secondary | ICD-10-CM

## 2017-08-25 DIAGNOSIS — Z860100 Personal history of colon polyps, unspecified: Secondary | ICD-10-CM

## 2017-08-25 DIAGNOSIS — M2550 Pain in unspecified joint: Secondary | ICD-10-CM

## 2017-08-25 MED ORDER — GABAPENTIN 100 MG PO CAPS: ORAL_CAPSULE | ORAL | 3 refills | Status: DC

## 2017-08-25 NOTE — Patient Instructions (Signed)
Colesterol  Cholesterol  El colesterol es una sustancia blanca, cerosa, similar a la grasa, que el cuerpo necesita en pequeñas cantidades. El hígado fabrica todo el colesterol que el cuerpo necesita. La sangre transporta el colesterol desde el hígado a través de los vasos sanguíneos. Los depósitos de colesterol (placas) podrían acumularse en las paredes de los vasos sanguíneos (arterias). Las placas provocan el estrechamiento y la rigidez de las arterias. Las placas de colesterol aumentan el riesgo de infarto de miocardio y accidente cerebrovascular.  Aunque sea muy elevado, la concentración de colesterol no puede percibirse. La única forma de saber que tiene colesterol alto es mediante un análisis de sangre. Una vez que se conocen las concentraciones de colesterol, se debe llevar un registro de los resultados de los análisis. Trabaje con el médico para mantener las concentraciones en el rango deseado.  ¿Qué significan los resultados?  · El colesterol total es una medida general de todo el colesterol en sangre.  · El colesterol LDL (lipoproteínas de baja densidad) es el colesterol “malo”. Este tipo es el que hace que se acumulen placas en las paredes de las arterias. Su concentración debe ser baja.  · El colesterol HDL (lipoproteínas de alta densidad) es el colesterol “bueno”, ya que limpia las arterias y arrastra el LDL. Su concentración debe ser alta.  · Los triglicéridos son grasas que el cuerpo puede quemar ya sea como fuente de energía o para almacenar. Las concentraciones altas están estrechamente vinculadas con las enfermedades cardíacas.  ¿Cuáles son las concentraciones de colesterol deseadas?  · El colesterol total debe estar por debajo de 200.  · Para las personas con riesgo, lo aconsejable es mantener el nivel de LDL por debajo de 100; y, para las personas con alto riesgo, es por debajo de 70.  · Se aconseja mantener un nivel de HDL es por encima de 40. Se considera  que un nivel de 60 o superior protege contra las enfermedades cardíacas.  · Los triglicéridos por debajo de 150.  ¿Cómo puedo bajar el colesterol?  Dieta  Siga el programa de alimentación que el médico le indique.  · Elija el pescado o la carne blanca de pollo y pavo, asados u horneados. Limite los cortes grasos de carne roja, los alimentos fritos y las carnes procesadas, como las salchichas y los embutidos.  · Coma gran cantidad de frutas y verduras frescas.  · Elija los cereales integrales, los frijoles, las pastas, las papas y los cereales.  · Elija aceite de oliva, aceite de maíz o aceite de canola, y solo use poca cantidad.  · No coma mantequilla, mayonesa, margarina o aceites de palmiste.  · Evite los alimentos que contengan grasas trans.  · Tome leche descremada o sin grasa y coma yogur y quesos descremados o sin grasa. Evite la leche entera, la crema, los helados, las yemas de huevo y los quesos enteros.  · Los postres sanos incluyen la torta ángel, los bocadillos de jengibre, las galletas con forma de animales, los caramelos duros, los helados de agua y el yogur helado descremado o semidescremado. Evite las masas, tortas, pasteles y galletas.    La práctica de actividad física.  · Siga el programa de ejercicios que le haya indicado el médico. Programa regular:  ? Ayuda a bajar el colesterol LDL y a aumentar el HDL.  ? Ayuda a controlar el peso.  · Haga cosas que aumenten el nivel de actividad; por ejemplo, haga los trabajos de jardinería, salga a caminar o use las   escaleras.  · Pregunte al médico sobre formas de aumentar la actividad en la vida diaria.    Medicamentos  · Tome los medicamentos de venta libre y los recetados solamente como se lo haya indicado el médico.  ? El médico puede recetarle medicamentos para ayudar a bajar el colesterol y reducir el riesgo de enfermedades cardíacas. Por lo general, esto se hace si con la dieta y la actividad física no se logra disminuir el nivel de colesterol.   ? Si tiene varios factores de riesgo, tal vez tenga que tomar medicamentos, incluso si las concentraciones son normales.    Esta información no tiene como fin reemplazar el consejo del médico. Asegúrese de hacerle al médico cualquier pregunta que tenga.  Document Released: 12/12/2004 Document Revised: 06/03/2016 Document Reviewed: 09/02/2015  [employer]vier Interactive Patient Education © 2018 [employer]

## 2017-08-25 NOTE — Progress Notes (Signed)
BP 116/60 (BP Location: Left Arm, Patient Position: Sitting, Cuff Size: Normal)   Pulse 74   Temp 97.9 F (36.6 C)   Ht 5' 0.75" (1.543 m)   Wt 148 lb 8 oz (67.4 kg)   LMP 03/19/2003 (Approximate)   SpO2 98%   BMI 28.29 kg/m    Subjective:    Patient ID: [name], female    DOB: 06-22-1965, 52 y.o.   MRN: [id number]  HPI: Breia Ocampo is a 52 y.o. female presenting on 08/25/2017 for Follow-up   HPI   Pt has still not turned in her [employer] application  Pt has an upcoming appointment with GI  She had mammogram today  Pt says the gabapentin makes her very sleepy.   Relevant past medical, surgical, family and social history reviewed and updated as indicated. Interim medical history since our last visit reviewed. Allergies and medications reviewed and updated.   Current Outpatient Medications:  .  gabapentin (NEURONTIN) 300 MG capsule, Take 1 capsule (300 mg total) by mouth 3 (three) times daily., Disp: 30 capsule, Rfl: 3 .  ibuprofen (ADVIL,MOTRIN) 200 MG tablet, Take 400-600 mg by mouth daily. , Disp: , Rfl:   Review of Systems  Constitutional: Negative for appetite change, chills, diaphoresis, fatigue, fever and unexpected weight change.  HENT: Positive for dental problem. Negative for congestion, drooling, ear pain, facial swelling, hearing loss, mouth sores, sneezing, sore throat, trouble swallowing and voice change.   Eyes: Negative for pain, discharge, redness, itching and visual disturbance.  Respiratory: Negative for cough, choking, shortness of breath and wheezing.   Cardiovascular: Negative for chest pain, palpitations and leg swelling.  Gastrointestinal: Negative for abdominal pain, blood in stool, constipation, diarrhea and vomiting.  Endocrine: Negative for cold intolerance, heat intolerance and polydipsia.  Genitourinary: Negative for decreased urine volume, dysuria and hematuria.  Musculoskeletal: Negative for arthralgias, back pain and gait  problem.  Skin: Negative for rash.  Allergic/Immunologic: Negative for environmental allergies.  Neurological: Negative for seizures, syncope, light-headedness and headaches.  Hematological: Negative for adenopathy.  Psychiatric/Behavioral: Negative for agitation, dysphoric mood and suicidal ideas. The patient is not nervous/anxious.     Per HPI unless specifically indicated above     Objective:    BP 116/60 (BP Location: Left Arm, Patient Position: Sitting, Cuff Size: Normal)   Pulse 74   Temp 97.9 F (36.6 C)   Ht 5' 0.75" (1.543 m)   Wt 148 lb 8 oz (67.4 kg)   LMP 03/19/2003 (Approximate)   SpO2 98%   BMI 28.29 kg/m   Wt Readings from Last 3 Encounters:  08/25/17 148 lb 8 oz (67.4 kg)  07/14/17 149 lb (67.6 kg)  04/17/17 150 lb (68 kg)    Physical Exam  Constitutional: She is oriented to person, place, and time. She appears well-developed and well-nourished.  HENT:  Head: Normocephalic and atraumatic.  Neck: Neck supple.  Cardiovascular: Normal rate and regular rhythm.  Pulmonary/Chest: Effort normal and breath sounds normal.  Abdominal: Soft. Bowel sounds are normal. She exhibits no mass. There is no hepatosplenomegaly. There is no tenderness.  Musculoskeletal: She exhibits no edema.  Lymphadenopathy:    She has no cervical adenopathy.  Neurological: She is alert and oriented to person, place, and time.  Skin: Skin is warm and dry.  Psychiatric: She has a normal mood and affect. Her behavior is normal.  Vitals reviewed.   Results for orders placed or performed during the hospital encounter of 07/17/17  INR/PT  Result Value Ref Range   Prothrombin Time 12.5 11.4 - 15.2 seconds   INR 0.94   Lipid panel  Result Value Ref Range   Cholesterol 194 0 - 200 mg/dL   Triglycerides 48 <782<150 mg/dL   HDL 67 >95>40 mg/dL   Total CHOL/HDL Ratio 2.9 RATIO   VLDL 10 0 - 40 mg/dL   LDL Cholesterol 621117 (H) 0 - 99 mg/dL  Comprehensive metabolic panel  Result Value Ref Range    Sodium 140 135 - 145 mmol/L   Potassium 4.0 3.5 - 5.1 mmol/L   Chloride 104 101 - 111 mmol/L   CO2 23 22 - 32 mmol/L   Glucose, Bld 88 65 - 99 mg/dL   BUN 19 6 - 20 mg/dL   Creatinine, Ser 3.080.75 0.44 - 1.00 mg/dL   Calcium 9.8 8.9 - 65.710.3 mg/dL   Total Protein 7.9 6.5 - 8.1 g/dL   Albumin 4.5 3.5 - 5.0 g/dL   AST 27 15 - 41 U/L   ALT 21 14 - 54 U/L   Alkaline Phosphatase 73 38 - 126 U/L   Total Bilirubin 0.8 0.3 - 1.2 mg/dL   GFR calc non Af Amer >60 >60 mL/min   GFR calc Af Amer >60 >60 mL/min   Anion gap 13 5 - 15  CBC  Result Value Ref Range   WBC 5.8 4.0 - 10.5 K/uL   RBC 4.66 3.87 - 5.11 MIL/uL   Hemoglobin 13.2 12.0 - 15.0 g/dL   HCT 84.640.7 96.236.0 - 95.246.0 %   MCV 87.3 78.0 - 100.0 fL   MCH 28.3 26.0 - 34.0 pg   MCHC 32.4 30.0 - 36.0 g/dL   RDW 84.112.2 32.411.5 - 40.115.5 %   Platelets 199 150 - 400 K/uL      Assessment & Plan:   Encounter Diagnoses  Name Primary?  Marland Kitchen. Arthralgia, unspecified joint Yes  . Fatigue, unspecified type   . History of colonic polyps   . Hyperlipidemia, unspecified hyperlipidemia type     -reviewed labs with pt -urged pt to Turn in [employer] application -decrease gabapentin due to sedation -pt to go to GI appointment as scheduled (for history of colon polyps) -counseled pt on lowfat diet and regular exercise -pt to follow up here 5 months.  RTO sooner prn

## 2017-09-01 ENCOUNTER — Ambulatory Visit (INDEPENDENT_AMBULATORY_CARE_PROVIDER_SITE_OTHER): Payer: Self-pay

## 2017-09-01 DIAGNOSIS — Z1211 Encounter for screening for malignant neoplasm of colon: Secondary | ICD-10-CM

## 2017-09-01 NOTE — Progress Notes (Signed)
Gastroenterology Pre-Procedure Review  Request Date:09/01/17 Requesting Physician: Jacquelin HawkingShannon McElroy NP ([facility] [place] [place] no polyps but doctor recommended repeat in 5 years. Copy in epic)  PATIENT REVIEW QUESTIONS: The patient responded to the following health history questions as indicated:    1. Diabetes Melitis: no 2. Joint replacements in the past 12 months: no 3. Major health problems in the past 3 months: no 4. Has an artificial valve or MVP: no 5. Has a defibrillator: no 6. Has been advised in past to take antibiotics in advance of a procedure like teeth cleaning: no 7. Family history of colon cancer: no  8. Alcohol Use: no 9. History of sleep apnea: no  10. History of coronary artery or other vascular stents placed within the last 12 months: no 11. History of any prior anesthesia complications: no    MEDICATIONS & ALLERGIES:    Patient reports the following regarding taking any blood thinners:   Plavix? no Aspirin? no Coumadin? no Brilinta? no Xarelto? no Eliquis? no Pradaxa? no Savaysa? no Effient? no  Patient confirms/reports the following medications:  Current Outpatient Medications  Medication Sig Dispense Refill  . gabapentin (NEURONTIN) 100 MG capsule 1 po bid prn pain.  Tome una tableta por boca dos veces diarias cuando sea necesario para Chief Technology Officerel dolor. 60 capsule 3  . ibuprofen (ADVIL,MOTRIN) 200 MG tablet Take 400-600 mg by mouth daily.     . lansoprazole (PREVACID) 15 MG capsule Take 15 mg by mouth as needed.     No current facility-administered medications for this visit.     Patient confirms/reports the following allergies:  Allergies  Allergen Reactions  . Codeine Shortness Of Breath  . Morphine And Related Shortness Of Breath  . Pamelor [Nortriptyline Hcl]     No orders of the defined types were placed in this encounter.   AUTHORIZATION INFORMATION Primary Insurance:pt assistance   SCHEDULE INFORMATION: Procedure has  been scheduled as follows:  Date: , Time:  Location:   This Gastroenterology Pre-Precedure Review Form is being routed to the following provider(s):

## 2017-09-01 NOTE — Progress Notes (Signed)
Pt came in for nurse visit with husband and an interpretor- she does not want to have tcs done right now, their daughter lives in [place] and is pregnant and they are worried that they will have to cancel the procedure if she goes into labor and they have to go to [place]. Pt will call back when she is ready. She said it may be some time in August or September. I gave her some information and our number and my name to call back.

## 2017-09-23 ENCOUNTER — Encounter: Payer: Self-pay | Admitting: Physician Assistant

## 2017-09-23 ENCOUNTER — Ambulatory Visit: Payer: Self-pay | Admitting: Physician Assistant

## 2017-09-23 VITALS — BP 114/74 | HR 78 | Temp 97.9°F | Ht 60.75 in | Wt 145.2 lb

## 2017-09-23 DIAGNOSIS — R5383 Other fatigue: Secondary | ICD-10-CM

## 2017-09-23 DIAGNOSIS — R109 Unspecified abdominal pain: Secondary | ICD-10-CM

## 2017-09-23 DIAGNOSIS — N3001 Acute cystitis with hematuria: Secondary | ICD-10-CM

## 2017-09-23 LAB — POCT URINALYSIS DIPSTICK
GLUCOSE UA: NEGATIVE
KETONES UA: 40
Leukocytes, UA: NEGATIVE
Nitrite, UA: NEGATIVE
PROTEIN UA: POSITIVE — AB
Urobilinogen, UA: 0.2 E.U./dL
pH, UA: 5.5 (ref 5.0–8.0)

## 2017-09-23 MED ORDER — CEPHALEXIN 500 MG PO CAPS: ORAL_CAPSULE | ORAL | 0 refills | Status: DC

## 2017-09-23 NOTE — Progress Notes (Signed)
BP 114/74 (BP Location: Left Arm, Patient Position: Sitting, Cuff Size: Normal)   Pulse 78   Temp 97.9 F (36.6 C)   Ht 5' 0.75" (1.543 m)   Wt 145 lb 4 oz (65.9 kg)   LMP 03/19/2003 (Approximate)   SpO2 98%   BMI 27.67 kg/m    Subjective:    Patient ID: [name], female    DOB: 06/04/1965, 52 y.o.   MRN: [id number]  HPI: Brieonna Crutcher is a 52 y.o. female presenting on 09/23/2017 for Follow-up (began a week ago. pt states she had abd pain for 2 days, had rash on her arms, has sore throat, mouth sores, body pain, sub fever, low appetite, fatigue. no vomitting, diarrhea, or cough/congestion)   HPI  Chief Complaint  Patient presents with  . Follow-up    began a week ago. pt states she had abd pain for 2 days, had rash on her arms, has sore throat, mouth sores, body pain, sub fever, low appetite, fatigue. no vomitting, diarrhea, or cough/congestion    abdominal pain went away.  She says her mouth hurts and she just feels like sleeping.  Only fever is subjective  She used benedryl for the rash which helped some.  Pt LMP age 42.    Relevant past medical, surgical, family and social history reviewed and updated as indicated. Interim medical history since our last visit reviewed. Allergies and medications reviewed and updated.   Current Outpatient Medications:  .  gabapentin (NEURONTIN) 100 MG capsule, 1 po bid prn pain.  Tome una tableta por boca dos veces diarias cuando sea necesario para [employer]., Disp: 60 capsule, Rfl: 3 .  lansoprazole (PREVACID) 15 MG capsule, Take 15 mg by mouth as needed., Disp: , Rfl:  .  NAPROXEN PO, Take 1 tablet by mouth daily., Disp: , Rfl:  .  ibuprofen (ADVIL,MOTRIN) 200 MG tablet, Take 400-600 mg by mouth daily. , Disp: , Rfl:    Review of Systems  Constitutional: Positive for chills, diaphoresis and fever (subjective). Negative for appetite change, fatigue and unexpected weight change.  HENT: Positive for dental problem and sore throat.  Negative for congestion, drooling, ear pain, facial swelling, hearing loss, mouth sores, sneezing, trouble swallowing and voice change.   Eyes: Negative for pain, discharge, redness, itching and visual disturbance.  Respiratory: Negative for cough, choking, shortness of breath and wheezing.   Cardiovascular: Negative for chest pain, palpitations and leg swelling.  Gastrointestinal: Positive for constipation. Negative for abdominal pain, blood in stool, diarrhea and vomiting.  Endocrine: Negative for cold intolerance, heat intolerance and polydipsia.  Genitourinary: Negative for decreased urine volume, dysuria and hematuria.  Musculoskeletal: Positive for arthralgias and back pain. Negative for gait problem.  Skin: Positive for rash.  Allergic/Immunologic: Negative for environmental allergies.  Neurological: Positive for headaches. Negative for seizures, syncope and light-headedness.  Hematological: Negative for adenopathy.  Psychiatric/Behavioral: Negative for agitation, dysphoric mood and suicidal ideas. The patient is not nervous/anxious.     Per HPI unless specifically indicated above     Objective:    BP 114/74 (BP Location: Left Arm, Patient Position: Sitting, Cuff Size: Normal)   Pulse 78   Temp 97.9 F (36.6 C)   Ht 5' 0.75" (1.543 m)   Wt 145 lb 4 oz (65.9 kg)   LMP 03/19/2003 (Approximate)   SpO2 98%   BMI 27.67 kg/m   Wt Readings from Last 3 Encounters:  09/23/17 145 lb 4 oz (65.9 kg)  08/25/17 148 lb 8  oz (67.4 kg)  07/14/17 149 lb (67.6 kg)    Physical Exam  Constitutional: She is oriented to person, place, and time. She appears well-developed and well-nourished.  HENT:  Head: Normocephalic and atraumatic.  Right Ear: Hearing, tympanic membrane, external ear and ear canal normal.  Left Ear: Hearing, tympanic membrane, external ear and ear canal normal.  Nose: Nose normal.  Mouth/Throat: Uvula is midline and oropharynx is clear and moist. No oropharyngeal exudate.   Neck: Neck supple.  Cardiovascular: Normal rate and regular rhythm.  Pulmonary/Chest: Effort normal and breath sounds normal. She has no wheezes.  Abdominal: Soft. Bowel sounds are normal. She exhibits no mass. There is no hepatosplenomegaly. There is no tenderness. There is no CVA tenderness.  Musculoskeletal: She exhibits no edema.  Lymphadenopathy:    She has no cervical adenopathy.  Neurological: She is alert and oriented to person, place, and time.  Skin: Skin is warm and dry.  Psychiatric: She has a normal mood and affect. Her behavior is normal.  Vitals reviewed.       Assessment & Plan:    Encounter Diagnoses  Name Primary?  . Acute cystitis with hematuria Yes  . Flank pain   . Fatigue, unspecified type     -rx keflex for urine.  Increase water PO -Get plenty of rest -Follow up as scheduled.  RTO sooner prn

## 2017-10-08 ENCOUNTER — Ambulatory Visit (HOSPITAL_COMMUNITY)
Admission: RE | Admit: 2017-10-08 | Discharge: 2017-10-08 | Disposition: A | Discharge status: Home / Self Care | Payer: Self-pay | Source: Ambulatory Visit | Attending: Physician Assistant | Admitting: Physician Assistant

## 2017-10-08 ENCOUNTER — Ambulatory Visit: Payer: Self-pay | Admitting: Physician Assistant

## 2017-10-08 ENCOUNTER — Other Ambulatory Visit (HOSPITAL_COMMUNITY)
Admission: RE | Admit: 2017-10-08 | Discharge: 2017-10-08 | Disposition: A | Discharge status: Home / Self Care | Payer: Self-pay | Source: Ambulatory Visit | Attending: Physician Assistant | Admitting: Physician Assistant

## 2017-10-08 ENCOUNTER — Encounter: Payer: Self-pay | Admitting: Physician Assistant

## 2017-10-08 VITALS — BP 118/70 | HR 66 | Temp 97.9°F | Ht 60.75 in | Wt 144.8 lb

## 2017-10-08 DIAGNOSIS — R5383 Other fatigue: Secondary | ICD-10-CM

## 2017-10-08 DIAGNOSIS — K120 Recurrent oral aphthae: Secondary | ICD-10-CM

## 2017-10-08 DIAGNOSIS — R109 Unspecified abdominal pain: Secondary | ICD-10-CM | POA: Insufficient documentation

## 2017-10-08 DIAGNOSIS — N200 Calculus of kidney: Secondary | ICD-10-CM | POA: Insufficient documentation

## 2017-10-08 DIAGNOSIS — R319 Hematuria, unspecified: Secondary | ICD-10-CM | POA: Insufficient documentation

## 2017-10-08 DIAGNOSIS — Z87442 Personal history of urinary calculi: Secondary | ICD-10-CM

## 2017-10-08 LAB — CBC WITH DIFFERENTIAL/PLATELET
BASOS ABS: 0.1 10*3/uL (ref 0.0–0.1)
Basophils Relative: 1 %
EOS PCT: 1 %
Eosinophils Absolute: 0.1 10*3/uL (ref 0.0–0.7)
HEMATOCRIT: 39 % (ref 36.0–46.0)
Hemoglobin: 12.7 g/dL (ref 12.0–15.0)
LYMPHS ABS: 2.2 10*3/uL (ref 0.7–4.0)
LYMPHS PCT: 39 %
MCH: 28.2 pg (ref 26.0–34.0)
MCHC: 32.6 g/dL (ref 30.0–36.0)
MCV: 86.5 fL (ref 78.0–100.0)
Monocytes Absolute: 0.7 10*3/uL (ref 0.1–1.0)
Monocytes Relative: 13 %
NEUTROS ABS: 2.6 10*3/uL (ref 1.7–7.7)
Neutrophils Relative %: 46 %
Platelets: 176 10*3/uL (ref 150–400)
RBC: 4.51 MIL/uL (ref 3.87–5.11)
RDW: 12.3 % (ref 11.5–15.5)
WBC: 5.6 10*3/uL (ref 4.0–10.5)

## 2017-10-08 LAB — POCT URINALYSIS DIPSTICK
GLUCOSE UA: NEGATIVE
KETONES UA: 15
LEUKOCYTES UA: NEGATIVE
NITRITE UA: NEGATIVE
PROTEIN UA: POSITIVE — AB
Urobilinogen, UA: 0.2 E.U./dL
pH, UA: 5 (ref 5.0–8.0)

## 2017-10-08 MED ORDER — MAGIC MOUTHWASH W/LIDOCAINE: ORAL | 1 refills | Status: DC

## 2017-10-08 NOTE — Progress Notes (Signed)
BP 118/70 (BP Location: Right Arm, Patient Position: Sitting, Cuff Size: Normal)   Pulse 66   Temp 97.9 F (36.6 C)   Ht 5' 0.75" (1.543 m)   Wt 144 lb 12 oz (65.7 kg)   LMP 03/19/2003 (Approximate)   SpO2 100%   BMI 27.58 kg/m    Subjective:    Patient ID: Kathryn Salas, female    DOB: 12/06/1965, 52 y.o.   MRN: [id number]  HPI: Kathryn Salas is a 52 y.o. female presenting on 10/08/2017 for Sore Throat (pt felt she had a fever yesterday because she was shaking, pt states mouth and throat hurts due to sores, pt states she felt stinging in her ears. little cough.. pt states she feels very fatigued. pt states sores in mouth went away while taking antibiotic but has returned)   HPI   Chief Complaint  Patient presents with  . Sore Throat    pt felt she had a fever yesterday because she was shaking, pt states mouth and throat hurts due to sores, pt states she felt stinging in her ears. little cough.. pt states she feels very fatigued. pt states sores in mouth went away while taking antibiotic but has returned    -Her mouth and throat hurt.  Hurts to swallow.  She says it started on Saturday.   -Very little cough -no emesis or diarrhea -no abd pain. -her joints and back and R  knee hurts -no pain with urination.  Sometimes her hemorrhoids hurt.   -LMP about 2005  Pt says her throat and all of her mouth hurt.  Pt saw urologist in [place] for kidney stones.   She says her kidney stone showed up on xray.   She is unaware of any pain from her kidney stones ever.     Relevant past medical, surgical, family and social history reviewed and updated as indicated. Interim medical history since our last visit reviewed. Allergies and medications reviewed and updated.   Current Outpatient Medications:  .  gabapentin (NEURONTIN) 100 MG capsule, 1 po bid prn pain.  Tome una tableta por boca dos veces diarias cuando sea necesario para Chief Technology Officerel dolor., Disp: 60 capsule, Rfl: 3 .  ibuprofen (ADVIL,MOTRIN)  200 MG tablet, Take 400-600 mg by mouth daily as needed for headache or moderate pain. , Disp: , Rfl:  .  NAPROXEN PO, Take 1 tablet by mouth daily as needed. , Disp: , Rfl:  .  lansoprazole (PREVACID) 15 MG capsule, Take 15 mg by mouth as needed., Disp: , Rfl:   Review of Systems  Constitutional: Positive for chills, diaphoresis and fever (subjective). Negative for appetite change, fatigue and unexpected weight change.  HENT: Positive for mouth sores, sore throat and trouble swallowing. Negative for congestion, drooling, ear pain, facial swelling, hearing loss, sneezing and voice change.   Eyes: Negative for pain, discharge, redness, itching and visual disturbance.  Respiratory: Negative for cough, choking, shortness of breath and wheezing.   Cardiovascular: Negative for chest pain, palpitations and leg swelling.  Gastrointestinal: Negative for abdominal pain, blood in stool, constipation, diarrhea and vomiting.  Endocrine: Negative for cold intolerance, heat intolerance and polydipsia.  Genitourinary: Negative for decreased urine volume, dysuria and hematuria.  Musculoskeletal: Positive for arthralgias and back pain. Negative for gait problem.  Skin: Negative for rash.  Allergic/Immunologic: Negative for environmental allergies.  Neurological: Positive for headaches. Negative for seizures, syncope and light-headedness.  Hematological: Negative for adenopathy.  Psychiatric/Behavioral: Negative for agitation, dysphoric mood and suicidal ideas. The  patient is not nervous/anxious.     Per HPI unless specifically indicated above     Objective:    BP 118/70 (BP Location: Right Arm, Patient Position: Sitting, Cuff Size: Normal)   Pulse 66   Temp 97.9 F (36.6 C)   Ht 5' 0.75" (1.543 m)   Wt 144 lb 12 oz (65.7 kg)   LMP 03/19/2003 (Approximate)   SpO2 100%   BMI 27.58 kg/m   Wt Readings from Last 3 Encounters:  10/08/17 144 lb 12 oz (65.7 kg)  09/23/17 145 lb 4 oz (65.9 kg)  08/25/17  148 lb 8 oz (67.4 kg)    Physical Exam  Constitutional: She is oriented to person, place, and time. She appears well-developed and well-nourished.  HENT:  Head: Normocephalic and atraumatic.  Neck: Neck supple.  Cardiovascular: Normal rate and regular rhythm.  Pulmonary/Chest: Effort normal and breath sounds normal.  Abdominal: Soft. Bowel sounds are normal. She exhibits no mass. There is no hepatosplenomegaly. There is tenderness (mild) in the suprapubic area. There is no rigidity, no rebound, no guarding and no CVA tenderness.  Musculoskeletal: She exhibits no edema.       Lumbar back: Normal.  Lymphadenopathy:    She has no cervical adenopathy.  Neurological: She is alert and oriented to person, place, and time.  Skin: Skin is warm and dry.  Psychiatric: She has a normal mood and affect. Her behavior is normal.  Vitals reviewed.   Results for orders placed or performed in visit on 09/23/17  POCT Urinalysis Dipstick  Result Value Ref Range   Color, UA DARK YELLOW    Clarity, UA     Glucose, UA Negative Negative   Bilirubin, UA SMALL    Ketones, UA 40    Spec Grav, UA >=1.030 (A) 1.010 - 1.025   Blood, UA TRACE-INTACT    pH, UA 5.5 5.0 - 8.0   Protein, UA Positive (A) Negative   Urobilinogen, UA 0.2 0.2 or 1.0 E.U./dL   Nitrite, UA NEG    Leukocytes, UA Negative Negative   Appearance     Odor        Assessment & Plan:    Encounter Diagnoses  Name Primary?  . Fatigue, unspecified type Yes  . Hematuria, unspecified type   . History of kidney stones   . Aphthous stomatitis   . Flank pain     -rx Magic mouthwash for aphthous ulcers -get KUB for hematuria and hx stones -Check cbc -counseled pt to get thermometer -rest, fluids, tylenol or ibuprofen -pt to follow up as scheduled.  RTO sooner prn

## 2017-10-08 NOTE — Patient Instructions (Signed)
Aftas (Canker Sores) Las aftas son llagas pequeas y dolorosas que aparecen en el interior de la boca. Se las conoce tambin como lceras aftosas. Las aftas pueden aparecer en la parte interna de los labios o las mejillas, la lengua o en cualquier lugar dentro de la boca. Puede tener una o varias aftas. Las aftas no pueden transmitirse de una persona a otra (no son contagiosas). Estas aftas son diferentes de las llagas que aparecen en la parte externa de los labios (llagas peribucales o herpes labial). Suelen comenzar como bultos rojos dolorosos y luego se convierten en lceras de color blanco, amarillo o gris con bordes rojos. Estas lceras pueden ser bastante dolorosas. El dolor puede volverse ms intenso al ingerir comidas o bebidas. CAUSAS Se desconoce la causa de esta afeccin. FACTORES DE RIESGO Es ms probable que esta afeccin se manifieste en:  Mujeres.  Adolescentes o personas que tienen entre 20 y30 aos.  Mujeres que estn menstruando.  Personas que estn bajo mucho estrs emocional.  Personas que no reciben el aporte suficiente de hierro o vitaminaB.  Personas cuya higiene bucal es deficiente.  Personas con lesiones dentro de la boca. Esto puede ocurrir despus de un tratamiento dental o por masticar algo duro. SNTOMAS Junto con las llagas, los sntomas tambin pueden incluir lo siguiente:  Fiebre.  Fatiga.  Inflamacin de los ganglios linfticos del cuello. DIAGNSTICO Esta afeccin se puede diagnosticar en funcin de los sntomas. El mdico tambin le examinar la boca. Adems, puede hacerle estudios si tiene aftas con frecuencia o si tienen mala apariencia. Entre ellos:  Anlisis de sangre para descartar otras causas de las aftas.  Tomar muestras de las aftas con un hisopo para detectar la presencia de infecciones.  Extraer un pequeo trozo de piel del afta (biopsia) para determinar si hay cncer. TRATAMIENTO La mayora de las aftas desaparecen sin  tratamiento en aproximadamente 10das. Generalmente, el cuidado en el hogar es el nico tratamiento necesario. Los medicamentos de venta libre pueden aliviar las molestias.Si las llagas estn muy extendidas, el mdico puede recetarle lo siguiente:  Ungento anestsico para aliviar el dolor.  Vitaminas.  Medicamentos con corticoides. Estos pueden administrarse de la siguiente forma: ? Comprimidos por va oral. ? Enjuagues bucales. ? Geles.  Enjuague bucal con antibitico. INSTRUCCIONES PARA EL CUIDADO EN EL HOGAR  Aplquese, tome o use los medicamentos solamente como se lo haya indicado el mdico. Estos incluyen las vitaminas.  Si le recetaron un enjuague bucal con antibitico, asegrese de terminarlo aunque comience a sentirse mejor.  Hasta que las aftas hayan cicatrizado: ? No beba caf ni jugos ctricos. ? No consuma alimentos picantes ni salados.  Use un enjuague bucal suave de venta libre como se lo haya indicado el mdico.  Mantenga una buena higiene bucal. ? Utilice hilo dental todos los das. ? Cepllese los dientes con un cepillo de cerdas suaves dos veces por da. SOLICITE ATENCIN MDICA SI:  Los sntomas no mejoran despus de dos semanas.  Tambin tiene fiebre o los ganglios inflamados.  Tiene aftas con frecuencia.  Tiene un afta que se le est agrandando.  No puede ingerir alimentos ni bebidas debido a las aftas. Esta informacin no tiene como fin reemplazar el consejo del mdico. Asegrese de hacerle al mdico cualquier pregunta que tenga. Document Released: 11/27/2011 Document Revised: 07/19/2014 Document Reviewed: 02/02/2014 Elsevier Interactive Patient Education  2018 [employer]  

## 2017-10-14 ENCOUNTER — Encounter: Payer: Self-pay | Admitting: Student

## 2017-10-14 ENCOUNTER — Encounter: Payer: Self-pay | Admitting: Physician Assistant

## 2017-10-14 NOTE — Telephone Encounter (Signed)
Error

## 2017-10-14 NOTE — Progress Notes (Signed)
Pt came by the office c/o cough and chest congestion. Pt was advised to try otc mucinex for congestion and robitussin for cough. Pt verbalized understanding.

## 2017-10-15 ENCOUNTER — Other Ambulatory Visit (HOSPITAL_COMMUNITY)
Admission: RE | Admit: 2017-10-15 | Discharge: 2017-10-15 | Disposition: A | Discharge status: Home / Self Care | Payer: No Typology Code available for payment source | Source: Ambulatory Visit | Attending: Physician Assistant | Admitting: Physician Assistant

## 2017-10-15 ENCOUNTER — Ambulatory Visit: Payer: Self-pay | Admitting: Physician Assistant

## 2017-10-15 ENCOUNTER — Ambulatory Visit (HOSPITAL_COMMUNITY)
Admission: RE | Admit: 2017-10-15 | Discharge: 2017-10-15 | Disposition: A | Discharge status: Home / Self Care | Payer: Self-pay | Source: Ambulatory Visit | Attending: Physician Assistant | Admitting: Physician Assistant

## 2017-10-15 ENCOUNTER — Encounter: Payer: Self-pay | Admitting: Physician Assistant

## 2017-10-15 VITALS — BP 113/70 | HR 95 | Temp 97.9°F | Ht 60.75 in | Wt 143.5 lb

## 2017-10-15 DIAGNOSIS — R059 Cough, unspecified: Secondary | ICD-10-CM

## 2017-10-15 DIAGNOSIS — R05 Cough: Secondary | ICD-10-CM | POA: Insufficient documentation

## 2017-10-15 DIAGNOSIS — J069 Acute upper respiratory infection, unspecified: Secondary | ICD-10-CM

## 2017-10-15 DIAGNOSIS — J029 Acute pharyngitis, unspecified: Secondary | ICD-10-CM

## 2017-10-15 DIAGNOSIS — R918 Other nonspecific abnormal finding of lung field: Secondary | ICD-10-CM | POA: Insufficient documentation

## 2017-10-15 LAB — CBC WITH DIFFERENTIAL/PLATELET
Basophils Absolute: 0 10*3/uL (ref 0.0–0.1)
Basophils Relative: 0 %
Eosinophils Absolute: 0.3 10*3/uL (ref 0.0–0.7)
Eosinophils Relative: 3 %
HEMATOCRIT: 39.6 % (ref 36.0–46.0)
Hemoglobin: 13 g/dL (ref 12.0–15.0)
LYMPHS PCT: 32 %
Lymphs Abs: 3.3 10*3/uL (ref 0.7–4.0)
MCH: 28.5 pg (ref 26.0–34.0)
MCHC: 32.8 g/dL (ref 30.0–36.0)
MCV: 86.8 fL (ref 78.0–100.0)
Monocytes Absolute: 0.8 10*3/uL (ref 0.1–1.0)
Monocytes Relative: 8 %
Neutro Abs: 6 10*3/uL (ref 1.7–7.7)
Neutrophils Relative %: 57 %
Platelets: 170 10*3/uL (ref 150–400)
RBC: 4.56 MIL/uL (ref 3.87–5.11)
RDW: 12.5 % (ref 11.5–15.5)
WBC: 10.3 10*3/uL (ref 4.0–10.5)

## 2017-10-15 LAB — POCT RAPID STREP A (OFFICE): Rapid Strep A Screen: NEGATIVE

## 2017-10-15 MED ORDER — ALBUTEROL SULFATE HFA 108 (90 BASE) MCG/ACT IN AERS: 2.0000 | INHALATION_SPRAY | Freq: Four times a day (QID) | RESPIRATORY_TRACT | 0 refills | Status: DC | PRN

## 2017-10-15 MED ORDER — BENZONATATE 100 MG PO CAPS: ORAL_CAPSULE | ORAL | 3 refills | Status: DC

## 2017-10-15 NOTE — Progress Notes (Signed)
BP 113/70 (BP Location: Right Arm, Patient Position: Sitting, Cuff Size: Normal)   Pulse 95   Temp 97.9 F (36.6 C)   Ht 5' 0.75" (1.543 m)   Wt 143 lb 8 oz (65.1 kg)   LMP 03/19/2003 (Approximate)   SpO2 99%   BMI 27.34 kg/m    Subjective:    Patient ID: Kathryn Salas, female    DOB: 08/26/1965, 52 y.o.   MRN: [id number]  HPI: Kathryn Fillernahi [place] is a 52 y.o. female presenting on 10/15/2017 for Cough (pt states pt temp yesterday afternoon 101F pt states she took naproxen and took a shower which she doesn't think helped much. pt states feels chest is tight, cough, sore throat and fever)   HPI   Chief Complaint  Patient presents with  . Cough    pt states pt temp yesterday afternoon 101F pt states she took naproxen and took a shower which she doesn't think helped much. pt states feels chest is tight, cough, sore throat and fever     Pt is not using anything for cough as recommended yesterday.  Cough just started about 5 days ago.  Just had elevated temperature yesterday.  Relevant past medical, surgical, family and social history reviewed and updated as indicated. Interim medical history since our last visit reviewed. Allergies and medications reviewed and updated.   Current Outpatient Medications:  .  gabapentin (NEURONTIN) 100 MG capsule, 1 po bid prn pain.  Tome una tableta por boca dos veces diarias cuando sea necesario para Chief Technology Officerel dolor., Disp: 60 capsule, Rfl: 3 .  lansoprazole (PREVACID) 15 MG capsule, Take 15 mg by mouth as needed., Disp: , Rfl:  .  magic mouthwash w/lidocaine SOLN, 5cc ( 1 teaspoon)  swish and swallow q 6 hour prn.  5 mL (1 cucharadita) enjuague y beba cada 6 horas cuando sea necesario., Disp: 120 mL, Rfl: 1 .  NAPROXEN PO, Take 1 tablet by mouth daily as needed. , Disp: , Rfl:    Review of Systems  Constitutional: Positive for chills, diaphoresis and fever. Negative for appetite change, fatigue and unexpected weight change.  HENT: Positive for dental  problem, sore throat and trouble swallowing. Negative for congestion, drooling, ear pain, facial swelling, hearing loss, mouth sores, sneezing and voice change.   Eyes: Negative for pain, discharge, redness, itching and visual disturbance.  Respiratory: Positive for cough. Negative for choking, shortness of breath and wheezing.   Cardiovascular: Negative for chest pain, palpitations and leg swelling.  Gastrointestinal: Negative for abdominal pain, blood in stool, constipation, diarrhea and vomiting.  Endocrine: Negative for cold intolerance, heat intolerance and polydipsia.  Genitourinary: Negative for decreased urine volume, dysuria and hematuria.  Musculoskeletal: Negative for arthralgias, back pain and gait problem.  Skin: Negative for rash.  Allergic/Immunologic: Negative for environmental allergies.  Neurological: Positive for headaches. Negative for seizures, syncope and light-headedness.  Hematological: Negative for adenopathy.  Psychiatric/Behavioral: Negative for agitation, dysphoric mood and suicidal ideas. The patient is not nervous/anxious.     Per HPI unless specifically indicated above     Objective:    BP 113/70 (BP Location: Right Arm, Patient Position: Sitting, Cuff Size: Normal)   Pulse 95   Temp 97.9 F (36.6 C)   Ht 5' 0.75" (1.543 m)   Wt 143 lb 8 oz (65.1 kg)   LMP 03/19/2003 (Approximate)   SpO2 99%   BMI 27.34 kg/m   Wt Readings from Last 3 Encounters:  10/15/17 143 lb 8 oz (65.1 kg)  10/08/17  144 lb 12 oz (65.7 kg)  09/23/17 145 lb 4 oz (65.9 kg)    Physical Exam  Constitutional: She is oriented to person, place, and time. She appears well-developed and well-nourished.  HENT:  Head: Normocephalic and atraumatic.  Right Ear: Hearing, tympanic membrane, external ear and ear canal normal.  Left Ear: Hearing, tympanic membrane, external ear and ear canal normal.  Nose: Nose normal.  Mouth/Throat: Uvula is midline and oropharynx is clear and moist. No  oropharyngeal exudate.  Neck: Neck supple.  Cardiovascular: Normal rate and regular rhythm.  Pulmonary/Chest: Effort normal and breath sounds normal. She has no wheezes.  Abdominal: Soft. Bowel sounds are normal. She exhibits no mass. There is no hepatosplenomegaly. There is no tenderness.  Musculoskeletal: She exhibits no edema.  Lymphadenopathy:    She has no cervical adenopathy.  Neurological: She is alert and oriented to person, place, and time.  Skin: Skin is warm and dry.  Psychiatric: She has a normal mood and affect. Her behavior is normal.  Vitals reviewed.   RST -      Assessment & Plan:    Encounter Diagnoses  Name Primary?  . Viral upper respiratory tract infection Yes  . Sore throat   . Cough     Recheck cbc again.  Check cxr.  Pt given rx for proair and tessalon.  Pt to RTO for any worsening or new symptoms or if not improving 2-3 days

## 2017-10-15 NOTE — Patient Instructions (Signed)
Tos en los adultos  Cough, Adult  La tos es un reflejo que despeja la garganta y las vías respiratorias. Toser ayuda a curar y proteger los pulmones. Es normal toser a veces, pero si la tos aparece junto con otros síntomas o si dura mucho tiempo, puede indicar una enfermedad que necesita tratamiento. La tos puede durar solo 2 o 3 semanas (aguda) o más de 8 semanas (crónica).  ¿Cuáles son las causas?  Por lo general, las causas de la tos son las siguientes:  · Aspirar sustancias que irritan los pulmones.  · Una infección respiratoria de origen viral o bacteriano.  · Alergias.  · Asma.  · Goteo posnasal.  · Fumar.  · Ácido que vuelve del estómago hacia el esófago (reflujo gastroesofágico ).  · Ciertos medicamentos.  · Enfermedades pulmonares crónicas, incluida la EPOC (o rara vez, cáncer de pulmón).  · Otras afecciones, por ejemplo, insuficiencia cardíaca.    Siga estas indicaciones en su casa:  Esté atento a cualquier cambio en los síntomas. Tome estas medidas para aliviar las molestias:  · Tome los medicamentos solamente como se lo haya indicado el médico.  ? Si le recetaron un antibiótico, tómelo como se lo haya indicado el médico. No deje de tomar los antibióticos aunque comience a sentirse mejor.  ? Hable con el médico antes de tomar un medicamento para la tos (antitusivo).  · Beba suficiente líquido para mantener la orina clara o de color amarillo pálido.  · Si el aire está seco, use un vaporizador o humidificador de niebla fría en la habitación o en la casa para ayudar a aflojar las secreciones.  · Evite todo lo que le provoque tos en el trabajo o en su casa.  · Si la tos empeora por la noche, pruebe a dormir en posición semierguida.  · Evite el humo del cigarrillo. Si fuma, deje de hacerlo. Si necesita ayuda para dejar de fumar, consulte al médico.  · Evite la cafeína.  · Evite el alcohol.  · Descanse todo lo que sea necesario.    Comuníquese con un médico si:  · Aparecen nuevos síntomas.   · Tose y escupe pus.  · La tos no mejora después de 2 o 3 semanas o empeora.  · No puede controlar la tos con antitusivos y no puede dormir debido a ello.  · Siente un dolor que empeora o que no puede controlar con medicamentos.  · Tiene fiebre.  · Baja de peso sin causa aparente.  · Tiene transpiración nocturna.  Solicite ayuda de inmediato si:  · Tose y escupe sangre.  · Tiene dificultad para respirar.  · El corazón le late muy rápido.  Esta información no tiene como fin reemplazar el consejo del médico. Asegúrese de hacerle al médico cualquier pregunta que tenga.  Document Released: 10/10/2010 Document Revised: 06/06/2016 Document Reviewed: 05/11/2014  Elsevier Interactive Patient Education © 2018 [employer]

## 2017-10-18 ENCOUNTER — Encounter (HOSPITAL_COMMUNITY): Payer: Self-pay | Admitting: Emergency Medicine

## 2017-10-18 ENCOUNTER — Emergency Department (HOSPITAL_COMMUNITY)
Admission: EM | Admit: 2017-10-18 | Discharge: 2017-10-18 | Disposition: A | Discharge status: Home / Self Care | Payer: No Typology Code available for payment source | Attending: Emergency Medicine | Admitting: Emergency Medicine

## 2017-10-18 ENCOUNTER — Other Ambulatory Visit: Payer: Self-pay

## 2017-10-18 DIAGNOSIS — J189 Pneumonia, unspecified organism: Secondary | ICD-10-CM

## 2017-10-18 DIAGNOSIS — J181 Lobar pneumonia, unspecified organism: Secondary | ICD-10-CM | POA: Insufficient documentation

## 2017-10-18 DIAGNOSIS — W57XXXA Bitten or stung by nonvenomous insect and other nonvenomous arthropods, initial encounter: Secondary | ICD-10-CM | POA: Insufficient documentation

## 2017-10-18 MED ORDER — DOXYCYCLINE HYCLATE 100 MG PO CAPS: 100.0000 mg | ORAL_CAPSULE | Freq: Two times a day (BID) | ORAL | 0 refills | Status: DC

## 2017-10-18 NOTE — ED Triage Notes (Signed)
Pt reports cough x 3 weeks.  Nonproductive in nature.  Went to pcp on 7/31 and given otc meds and tessalon, but no relief.

## 2017-10-18 NOTE — Discharge Instructions (Addendum)
Take antibiotics as directed for infection. Take Tylenol every 4 hours as needed for fever or you can take Motrin/ibuprofen every 6 hours as needed for pain and fever. If you continue to have intermittent fevers you need to be seen again by a primary care doctor.

## 2017-10-18 NOTE — ED Provider Notes (Signed)
[facility] EMERGENCY DEPARTMENT Provider Note   CSN: [id number] Arrival date & time: 10/18/17  4098     History   Chief Complaint Chief Complaint  Patient presents with  . Cough    HPI Kathryn Salas is a 52 y.o. female.  Patient with history of kidney stones presents for recurrent cough and fever for the past 3 weeks.  Patient was seen proximal with 3 weeks ago and was diagnosed with urine infection completed treatment and urinary symptoms resolved.  Patient was seen again for recurrent symptoms and was given cough medicines.  Patient had blood work done approximately 1 week ago which was unremarkable and chest x-ray which showed nonspecific findings.  Patient concerned is still having recurrent cough intermittent fever and occasional night sweats.  Patient denies TB history, no recent travel or sick contacts.  No known cancer history.     Past Medical History:  Diagnosis Date  . Anxiety   . Arthritis   . Fatty liver   . History of kidney stones   . Migraines     Patient Active Problem List   Diagnosis Date Noted  . History of colonic polyps 02/22/2016  . Gastroesophageal reflux disease 01/04/2016  . Chronic back pain 01/04/2016    History reviewed. No pertinent surgical history.   OB History   None      Home Medications    Prior to Admission medications   Medication Sig Start Date End Date Taking? Authorizing Provider  benzonatate (TESSALON PERLES) 100 MG capsule 1 - 2 po Q 8 hour prn cough Patient taking differently: Take 200 mg by mouth every 8 (eight) hours as needed. 1 - 2 po Q 8 hour prn cough 10/15/17  Yes Jacquelin Hawking, PA-C  dextromethorphan-guaiFENesin (ROBITUSSIN-DM) 10-100 MG/5ML liquid Take 20 mLs by mouth every 4 (four) hours as needed for cough.   Yes [provider]  gabapentin (NEURONTIN) 100 MG capsule 1 po bid prn pain.  Tome una tableta por boca dos veces diarias cuando sea necesario para Chief Technology Officer. 08/25/17  Yes Jacquelin Hawking,  PA-C  naproxen (NAPROSYN) 250 MG tablet Take 1 tablet by mouth daily as needed.    Yes [provider]  ranitidine (ZANTAC) 150 MG tablet Take 150 mg by mouth daily as needed for heartburn.   Yes [provider]  albuterol (PROVENTIL HFA;VENTOLIN HFA) 108 (90 Base) MCG/ACT inhaler Inhale 2 puffs into the lungs every 6 (six) hours as needed for wheezing or shortness of breath. Patient not taking: Reported on 10/18/2017 10/15/17   Jacquelin Hawking, PA-C  doxycycline (VIBRAMYCIN) 100 MG capsule Take 1 capsule (100 mg total) by mouth 2 (two) times daily. One po bid x 7 days 10/18/17   Blane Ohara, MD  magic mouthwash w/lidocaine SOLN 5cc ( 1 teaspoon)  swish and swallow q 6 hour prn.  5 mL (1 cucharadita) enjuague y beba cada 6 horas cuando sea necesario. Patient not taking: Reported on 10/18/2017 10/08/17   Jacquelin Hawking, PA-C    Family History Family History  Problem Relation Age of Onset  . Cancer Father        Stomach Cancer  . Cancer Maternal Aunt        Breast  . Breast cancer Maternal Aunt   . Cancer Paternal Grandmother        ovarian cancer    Social History Social History   Tobacco Use  . Smoking status: Never Smoker  . Smokeless tobacco: Never Used  Substance Use Topics  .  Alcohol use: No  . Drug use: No     Allergies   Codeine; Morphine and related; and Pamelor [nortriptyline hcl]   Review of Systems Review of Systems  Constitutional: Positive for fever. Negative for chills.  HENT: Positive for congestion.   Eyes: Negative for visual disturbance.  Respiratory: Positive for cough. Negative for shortness of breath.   Cardiovascular: Negative for chest pain.  Gastrointestinal: Negative for abdominal pain and vomiting.  Genitourinary: Negative for dysuria and flank pain.  Musculoskeletal: Negative for back pain, neck pain and neck stiffness.  Skin: Negative for rash.  Neurological: Negative for light-headedness and headaches.     Physical  Exam Updated Vital Signs BP (!) 145/91 (BP Location: Left Arm)   Pulse (!) 105   Temp 99.1 F (37.3 C) (Oral)   Resp 20   Ht 5' (1.524 m)   Wt 64.9 kg (143 lb)   LMP 03/19/2003 (Approximate)   SpO2 98%   BMI 27.93 kg/m   Physical Exam  Constitutional: She is oriented to person, place, and time. She appears well-developed and well-nourished.  HENT:  Head: Normocephalic and atraumatic.  Eyes: Conjunctivae are normal. Right eye exhibits no discharge. Left eye exhibits no discharge.  Neck: Normal range of motion. Neck supple. No tracheal deviation present.  Cardiovascular: Normal rate and regular rhythm.  Pulmonary/Chest: Effort normal. She has rales (left base).  Abdominal: Soft. She exhibits no distension. There is no tenderness. There is no guarding.  Musculoskeletal: She exhibits no edema.  Neurological: She is alert and oriented to person, place, and time.  Skin: Skin is warm. No rash noted.  Psychiatric: She has a normal mood and affect.  Nursing note and vitals reviewed.    ED Treatments / Results  Labs (all labs ordered are listed, but only abnormal results are displayed) Labs Reviewed - No data to display  EKG None  Radiology No results found.  Procedures Procedures (including critical care time)  Medications Ordered in ED Medications - No data to display   Initial Impression / Assessment and Plan / ED Course  I have reviewed the triage vital signs and the nursing notes.  Pertinent labs & imaging results that were available during my care of the patient were reviewed by me and considered in my medical decision making (see chart for details).    Overall well-appearing female comes in with recurrent cough and fever.  Clinical concern for community acquired pneumonia.  Chest x-ray from last week reviewed showing nonspecific findings.  Blood work reviewed CBC unremarkable.  Discussed importance of close outpatient follow-up and will prescribe doxycycline to  cover pneumonia and recent tick bite that she had as well.  Results and differential diagnosis were discussed with the patient/parent/guardian. Xrays were independently reviewed by myself.  Close follow up outpatient was discussed, comfortable with the plan.   Medications - No data to display  Vitals:   10/18/17 0849 10/18/17 0850  BP:  (!) 145/91  Pulse:  (!) 105  Resp:  20  Temp:  99.1 F (37.3 C)  TempSrc:  Oral  SpO2:  98%  Weight: 64.9 kg (143 lb)   Height: 5' (1.524 m)     Final diagnoses:  Community acquired pneumonia of left lower lobe of lung (HCC)  Tick bite, initial encounter     Final Clinical Impressions(s) / ED Diagnoses   Final diagnoses:  Community acquired pneumonia of left lower lobe of lung (HCC)  Tick bite, initial encounter    ED Discharge  Orders        Ordered    doxycycline (VIBRAMYCIN) 100 MG capsule  2 times daily     10/18/17 1006       Blane Ohara, MD 10/18/17 1009

## 2017-10-25 ENCOUNTER — Encounter (HOSPITAL_COMMUNITY): Payer: Self-pay | Admitting: Emergency Medicine

## 2017-10-25 ENCOUNTER — Emergency Department (HOSPITAL_COMMUNITY)
Admission: EM | Admit: 2017-10-25 | Discharge: 2017-10-25 | Disposition: A | Discharge status: Home / Self Care | Payer: Self-pay | Attending: Emergency Medicine | Admitting: Emergency Medicine

## 2017-10-25 ENCOUNTER — Other Ambulatory Visit: Payer: Self-pay

## 2017-10-25 DIAGNOSIS — R059 Cough, unspecified: Secondary | ICD-10-CM

## 2017-10-25 DIAGNOSIS — R05 Cough: Secondary | ICD-10-CM | POA: Insufficient documentation

## 2017-10-25 MED ORDER — PREDNISONE 20 MG PO TABS: 40.0000 mg | ORAL_TABLET | Freq: Every day | ORAL | 0 refills | Status: AC

## 2017-10-25 NOTE — ED Triage Notes (Signed)
Patient here for recheck of cough. Patient seen here in ED on 8/3 and diagnosed with pneumonia. Patient given antibiotics, doxycycline. Per patient told to return if cough remained. Denies any fevers, per patient weather change from inside to outside aggravates cough. Cough is nonproductive. Patient does report some swelling in legs bilateral.

## 2017-10-25 NOTE — Discharge Instructions (Signed)
Your cough is most likely post viral infection. You have been given Prednisone, please take as prescribed. You may also use your home Albuterol inhaler and Robitussin for the cough. Please follow up with your primary care provider or return to the ED for new or worsening symptoms.  Comunquese con un mdico si: Aparecen nuevos sntomas. Tose y escupe pus. La tos no mejora despus de 2 o 3 semanas o empeora. No puede controlar la tos con antitusivos y no puede dormir debido a Secretary/administratorello. Siente un dolor que empeora o que no puede controlar con medicamentos. Tiene fiebre. Baja de peso sin causa aparente. Tiene transpiracin nocturna. Solicite ayuda de inmediato si: Tose y Commercial Metals Companyescupe sangre. Tiene dificultad para respirar. [place] Hers[place] Companycorazn le late [place] rpid[place].

## 2017-10-25 NOTE — ED Provider Notes (Signed)
The patient is a 52 year old female, she presents with ongoing coughing despite being treated with doxycycline for the last 10 days after being diagnosed with a left-sided community-acquired pneumonia by her family doctor.  I reviewed the x-rays, I have seen them, there did appear to be some atelectasis at the left base however there is no fluffy infiltrate noted.  On my exam the patient is having a dry cough throughout the exam, she is not wheezing, she speaks in full sentences, when she does take a deep breath it triggers a cough which she also states is worse at night.  No significant edema, no rales, no wheezing, no tachycardia, otherwise appears well.  I do not think she needs repeat imaging, she would benefit from a prednisone course for 5 days, she can continue antitussive therapy, she states that both prednisone and albuterol make her have tachycardia so she is resistant to using any of those.  I recommended using a lower dose of the prednisone to which she is agreeable.  At this time she is stable for discharge.  She is agreeable to return for worsening symptoms.  Medical screening examination/treatment/procedure(s) were conducted as a shared visit with non-physician practitioner(s) and myself.  I personally evaluated the patient during the encounter.  Clinical Impression:   Final diagnoses:  Cough         Eber HongMiller, Sir Mallis, MD 10/26/17 1531

## 2017-10-25 NOTE — ED Provider Notes (Signed)
[facility] EMERGENCY DEPARTMENT Provider Note   CSN: [id number] Arrival date & time: 10/25/17  6295   History   Chief Complaint Chief Complaint  Patient presents with  . Cough    HPI Kathryn Salas is a 52 y.o. female with a past medical history significant for left sided CAP diagnosed on 10/18/17 presents for evaluation of persistent cough. Finished course of Doxycycline 10/25/17. Denies SOB, chest pain, fatigue, wheeze, fever, chills, night sweats, leg swelling, productive cough, history of TB, travel, sick contacts or PND. States the cough is worse at night and with deep breathing. Admits to a history of GERD, however does not take medication for her reflux. States she returned to the ED today, because her daughter is about to give birth and she " does not want to get the baby sick."    Past Medical History:  Diagnosis Date  . Anxiety   . Arthritis   . Fatty liver   . History of kidney stones   . Migraines     Patient Active Problem List   Diagnosis Date Noted  . History of colonic polyps 02/22/2016  . Gastroesophageal reflux disease 01/04/2016  . Chronic back pain 01/04/2016    History reviewed. No pertinent surgical history.   OB History   None      Home Medications    Prior to Admission medications   Medication Sig Start Date End Date Taking? Authorizing Provider  albuterol (PROVENTIL HFA;VENTOLIN HFA) 108 (90 Base) MCG/ACT inhaler Inhale 2 puffs into the lungs every 6 (six) hours as needed for wheezing or shortness of breath. Patient not taking: Reported on 10/18/2017 10/15/17   Jacquelin Hawking, PA-C  benzonatate (TESSALON PERLES) 100 MG capsule 1 - 2 po Q 8 hour prn cough Patient taking differently: Take 200 mg by mouth every 8 (eight) hours as needed. 1 - 2 po Q 8 hour prn cough 10/15/17   Jacquelin Hawking, PA-C  dextromethorphan-guaiFENesin (ROBITUSSIN-DM) 10-100 MG/5ML liquid Take 20 mLs by mouth every 4 (four) hours as needed for cough.    [provider]  doxycycline (VIBRAMYCIN) 100 MG capsule Take 1 capsule (100 mg total) by mouth 2 (two) times daily. One po bid x 7 days 10/18/17   Blane Ohara, MD  gabapentin (NEURONTIN) 100 MG capsule 1 po bid prn pain.  Tome una tableta por boca dos veces diarias cuando sea necesario para Chief Technology Officer. 08/25/17   Jacquelin Hawking, PA-C  magic mouthwash w/lidocaine SOLN 5cc ( 1 teaspoon)  swish and swallow q 6 hour prn.  5 mL (1 cucharadita) enjuague y beba cada 6 horas cuando sea necesario. Patient not taking: Reported on 10/18/2017 10/08/17   Jacquelin Hawking, PA-C  naproxen (NAPROSYN) 250 MG tablet Take 1 tablet by mouth daily as needed.     [provider]  predniSONE (DELTASONE) 20 MG tablet Take 2 tablets (40 mg total) by mouth daily for 5 days. 10/25/17 10/30/17  Shirline Kendle A, PA-C  ranitidine (ZANTAC) 150 MG tablet Take 150 mg by mouth daily as needed for heartburn.    [provider]    Family History Family History  Problem Relation Age of Onset  . Cancer Father        Stomach Cancer  . Cancer Maternal Aunt        Breast  . Breast cancer Maternal Aunt   . Cancer Paternal Grandmother        ovarian cancer    Social History Social History   Tobacco  Use  . Smoking status: Never Smoker  . Smokeless tobacco: Never Used  Substance Use Topics  . Alcohol use: No  . Drug use: No     Allergies   Codeine; Morphine and related; and Pamelor [nortriptyline hcl]   Review of Systems Review of Systems  Constitutional: Negative for chills and fever.  HENT: Negative for congestion and sinus pressure.   Respiratory: Positive for cough. Negative for choking, chest tightness, shortness of breath, wheezing and stridor.   Cardiovascular: Negative for chest pain, palpitations and leg swelling.  Gastrointestinal: Negative for abdominal pain.  All other systems reviewed and are negative.    Physical Exam Updated Vital Signs BP 138/76 (BP Location: Left Arm)   Pulse 65   Temp  97.9 F (36.6 C) (Oral)   Resp 16   Ht 5' (1.524 m)   Wt 64.8 kg   LMP 03/19/2003 (Approximate)   SpO2 100%   BMI 27.90 kg/m   Physical Exam  Constitutional: She appears well-developed and well-nourished. No distress.  HENT:  Head: Normocephalic and atraumatic.  Right Ear: External ear normal.  Left Ear: External ear normal.  Nose: Nose normal.  Mouth/Throat: Oropharynx is clear and moist.  Eyes: Pupils are equal, round, and reactive to light.  Neck: Normal range of motion. Neck supple.  Cardiovascular: Normal rate, regular rhythm, normal heart sounds and intact distal pulses.  No murmur heard. Pulmonary/Chest: Effort normal and breath sounds normal. No stridor. No respiratory distress. She has no wheezes. She has no rales. She exhibits no tenderness.  Abdominal: Soft. Bowel sounds are normal. She exhibits no distension. There is no tenderness.  Musculoskeletal: Normal range of motion.  Neurological: She is alert.  Skin: Skin is warm and dry. No rash noted. She is not diaphoretic.  Psychiatric: She has a normal mood and affect.  Nursing note and vitals reviewed.    ED Treatments / Results  Labs (all labs ordered are listed, but only abnormal results are displayed) Labs Reviewed - No data to display  EKG None  Radiology No results found.  Procedures Procedures (including critical care time)  Medications Ordered in ED Medications - No data to display   Initial Impression / Assessment and Plan / ED Course  I have reviewed the triage vital signs and the nursing notes.  Pertinent labs & imaging results that were available during my care of the patient were reviewed by me and considered in my medical decision making (see chart for details).  Patient presents for persistent cough after diagnosis of left sided lower lobe community acquired pneumonia. Denies fever, chills, productive cough. Patient is afebrile and not ill appearing. Her lungs are clear with no wheeze,  rales or rhonchi. Able to speak in full sentences and no signs of respiratory distress. I have reviewed the previous chest x ray which revealed left sided atelectasis. Most likely at this time her symptoms are related to a post viral bronchitis cough. Will be discharged home with a 5 day course of prednisone. Does have albuterol inhaler and antitussive medications at home however states that they give her tachycardia. Do not feel it is necessary to repeat imaging or labs. Pt has been advised to return to the ED if symptoms worsen or they do not improve. Pt verbalizes understanding and is agreeable with plan.     Final Clinical Impressions(s) / ED Diagnoses   Final diagnoses:  Cough    ED Discharge Orders         Ordered  predniSONE (DELTASONE) 20 MG tablet  Daily     10/25/17 1020           Peniel Hass A, PA-C 10/25/17 1059    Eber Hong, MD 10/26/17 [id number]

## 2018-01-26 ENCOUNTER — Ambulatory Visit: Payer: Self-pay | Admitting: Physician Assistant

## 2018-12-03 ENCOUNTER — Other Ambulatory Visit (HOSPITAL_COMMUNITY): Payer: Self-pay | Admitting: *Deleted

## 2018-12-03 DIAGNOSIS — Z1231 Encounter for screening mammogram for malignant neoplasm of breast: Secondary | ICD-10-CM

## 2019-01-16 IMAGING — DX DG CHEST 2V
2 series · 2 of 2 positions shown · non-contrast
Comparison: None.

CLINICAL DATA: Acute cough for 5 days.  Fever for 1 day.

EXAM:
CHEST - 2 VIEW

[chest pa]
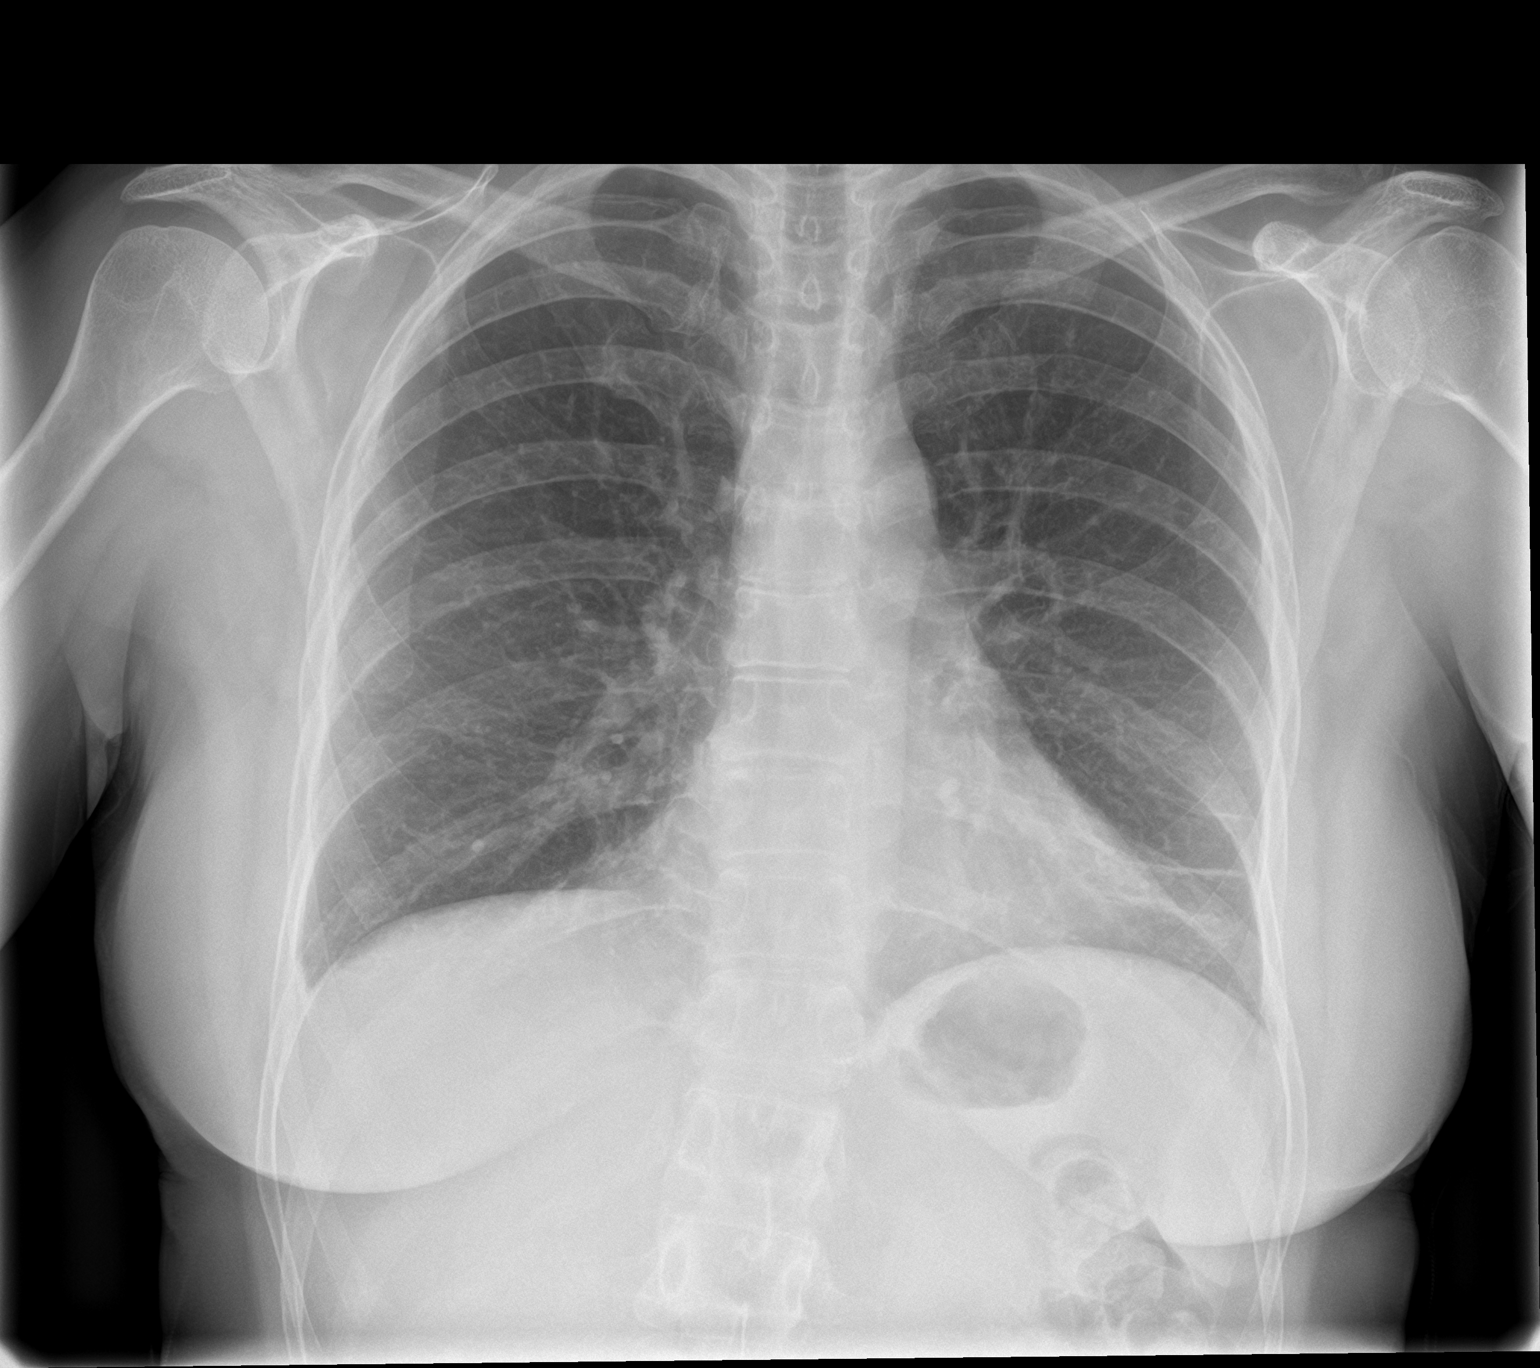

[chest lat]
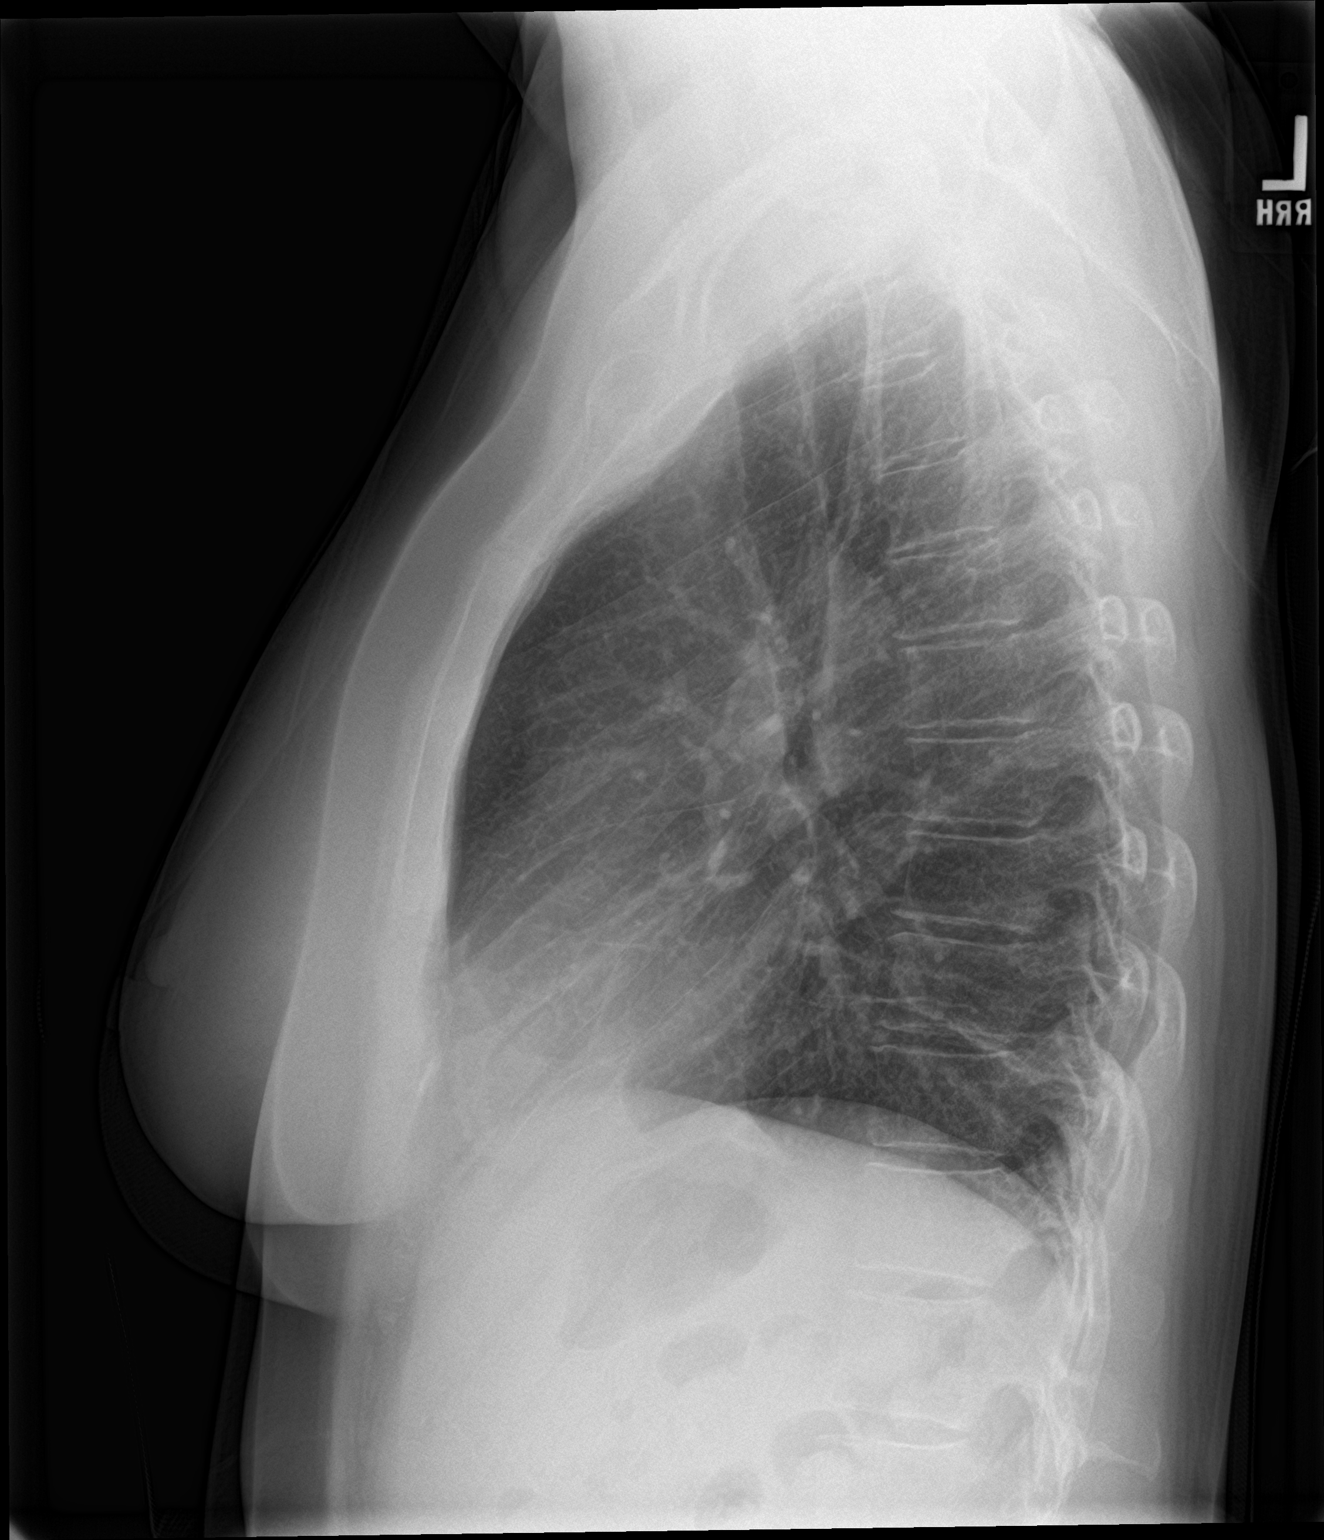

[2 of 2 positions shown; findings below may reference images not displayed]

FINDINGS: The cardiomediastinal silhouette is unremarkable.

Mild subsegmental atelectasis/scarring within the LEFT lung base
noted.

There is no evidence of focal airspace disease, pulmonary edema,
suspicious pulmonary nodule/mass, pleural effusion, or pneumothorax.

No acute bony abnormalities are identified.
IMPRESSION: Mild LEFT basilar subsegmental atelectasis/scarring. No other acute
abnormality.

## 2019-02-16 ENCOUNTER — Encounter (HOSPITAL_COMMUNITY): Payer: Self-pay

## 2019-02-16 ENCOUNTER — Other Ambulatory Visit: Payer: Self-pay

## 2019-02-16 ENCOUNTER — Ambulatory Visit
Admission: RE | Admit: 2019-02-16 | Discharge: 2019-02-16 | Disposition: A | Discharge status: Home / Self Care | Payer: No Typology Code available for payment source | Source: Ambulatory Visit | Attending: Obstetrics and Gynecology | Admitting: Obstetrics and Gynecology

## 2019-02-16 ENCOUNTER — Ambulatory Visit (HOSPITAL_COMMUNITY)
Admission: RE | Admit: 2019-02-16 | Discharge: 2019-02-16 | Disposition: A | Discharge status: Home / Self Care | Payer: Self-pay | Source: Ambulatory Visit | Attending: Obstetrics and Gynecology | Admitting: Obstetrics and Gynecology

## 2019-02-16 DIAGNOSIS — Z1239 Encounter for other screening for malignant neoplasm of breast: Secondary | ICD-10-CM | POA: Insufficient documentation

## 2019-02-16 DIAGNOSIS — Z1231 Encounter for screening mammogram for malignant neoplasm of breast: Secondary | ICD-10-CM

## 2019-02-16 HISTORY — DX: Encounter for other screening for malignant neoplasm of breast: Z12.39

## 2019-02-16 NOTE — Patient Instructions (Signed)
Explained breast self awareness with [name]. Patient did not need a Pap smear today due to last Pap smear was 09/04/2016. Let her know B[facility]CP will cover Pap smears every 3 years unless has a history of abnormal Pap smears. Referred patient to the [facility] for a screening mammogram. Appointment scheduled for Tuesday, February 16, 2019 at 1440. Patient aware of appointment and will be there. Let patient know the [facility] will follow up with her within the next couple weeks with results of mammogram by letter or phone. [name] verbalized understanding.  Tavien Chestnut, Arvil Chaco, RN 2:28 PM

## 2019-02-16 NOTE — Progress Notes (Signed)
No complaints today.   Pap Smear: Pap smear not completed today. Last Pap smear was 09/04/2016 at the [facility] and normal. Per patient has no history of an abnormal Pap smear. Last Pap smear result is in Epic.  Physical exam: Breasts Breasts symmetrical. No skin abnormalities bilateral breasts. No nipple retraction bilateral breasts. No nipple discharge bilateral breasts. No lymphadenopathy. No lumps palpated bilateral breasts. No complaints of pain or tenderness on exam. Referred patient to the [facility] for a screening mammogram. Appointment scheduled for Tuesday, February 16, 2019 at 1440.        Pelvic/Bimanual No Pap smear completed today since last Pap smear was 09/04/2016. Pap smear not indicated per BCCCP guidelines.   Smoking History: Patient has never smoked.  Patient Navigation: Patient education provided. Access to services provided for patient through [facility] program. Spanish interpreter provided.   Colorectal Cancer Screening: Per patient had a colonoscopy completed in 2013. No complaints today.   Breast and Cervical Cancer Risk Assessment: Patient has a family history of a maternal aunt having breast cancer. Patient has no known genetic mutations or history of radiation treatment to the chest before age 62. Patient has no history of cervical dysplasia, immunocompromised, or DES exposure in-utero.  Risk Assessment    Risk Scores      02/16/2019   Last edited by: Loletta Parish, RN   5-year risk: 1 %   Lifetime risk: 8.5 %         Used Spanish interpreter Rudene Anda from [place].

## 2019-03-15 ENCOUNTER — Other Ambulatory Visit: Payer: Self-pay

## 2019-03-15 ENCOUNTER — Ambulatory Visit: Payer: No Typology Code available for payment source | Attending: Internal Medicine

## 2019-03-15 DIAGNOSIS — Z20822 Contact with and (suspected) exposure to covid-19: Secondary | ICD-10-CM

## 2019-03-15 NOTE — Addendum Note (Signed)
Addended byDanielle Dess on: 03/15/2019 08:34 AM   Modules accepted: Orders

## 2019-03-17 LAB — NOVEL CORONAVIRUS, NAA: SARS-CoV-2, NAA: NOT DETECTED

## 2020-01-13 ENCOUNTER — Ambulatory Visit: Payer: No Typology Code available for payment source | Attending: Internal Medicine

## 2020-01-13 DIAGNOSIS — Z23 Encounter for immunization: Secondary | ICD-10-CM

## 2020-01-13 NOTE — Progress Notes (Signed)
   Covid-19 Vaccination Clinic  Name:  Kathryn Salas    MRN: [id number] DOB: Jan 12, 1966  01/13/2020  [name] was observed post Covid-19 immunization for 15 minutes without incident. She was provided with Vaccine Information Sheet and instruction to access the V-Safe system.   [name] was instructed to call 911 with any severe reactions post vaccine: Marland Kitchen Difficulty breathing  . Swelling of face and throat  . A fast heartbeat  . A bad rash all over body  . Dizziness and weakness

## 2020-04-20 ENCOUNTER — Other Ambulatory Visit: Payer: Self-pay | Admitting: Obstetrics and Gynecology

## 2020-04-20 DIAGNOSIS — Z1231 Encounter for screening mammogram for malignant neoplasm of breast: Secondary | ICD-10-CM

## 2020-06-01 ENCOUNTER — Ambulatory Visit
Admission: RE | Admit: 2020-06-01 | Discharge: 2020-06-01 | Disposition: A | Discharge status: Home / Self Care | Payer: No Typology Code available for payment source | Source: Ambulatory Visit | Attending: Obstetrics and Gynecology | Admitting: Obstetrics and Gynecology

## 2020-06-01 ENCOUNTER — Other Ambulatory Visit: Payer: Self-pay

## 2020-06-01 ENCOUNTER — Ambulatory Visit: Payer: Self-pay | Admitting: *Deleted

## 2020-06-01 VITALS — BP 132/78 | Wt 143.1 lb

## 2020-06-01 DIAGNOSIS — Z1231 Encounter for screening mammogram for malignant neoplasm of breast: Secondary | ICD-10-CM

## 2020-06-01 DIAGNOSIS — Z1239 Encounter for other screening for malignant neoplasm of breast: Secondary | ICD-10-CM

## 2020-06-01 NOTE — Progress Notes (Signed)
[name] is a 55 y.o. female who presents to [facility] clinic today with no complaints.    Pap Smear: Pap smear not completed today. Last Pap smear was 09/04/2016 at the [facility] and normal. Per patient has no history of an abnormal Pap smear. Last Pap smear result is in Epic.   Physical exam: Breasts Breasts symmetrical. No skin abnormalities bilateral breasts. No nipple retraction bilateral breasts. No nipple discharge bilateral breasts. No lymphadenopathy. No lumps palpated bilateral breasts. No complaints of pain or tenderness on exam.      MS DIGITAL SCREENING TOMO BILATERAL  Result Date: 02/16/2019 CLINICAL DATA:  Screening. EXAM: DIGITAL SCREENING BILATERAL MAMMOGRAM WITH TOMO AND CAD COMPARISON:  Previous exam(s). ACR Breast Density Category b: There are scattered areas of fibroglandular density. FINDINGS: There are no findings suspicious for malignancy. Images were processed with CAD. IMPRESSION: No mammographic evidence of malignancy. A result letter of this screening mammogram will be mailed directly to the patient. RECOMMENDATION: Screening mammogram in one year. (Code:SM-B-01Y) BI-RADS CATEGORY  1: Negative. Electronically Signed   By: Kathryn Salas M.D.   On: 02/16/2019 15:41   MS DIGITAL SCREENING TOMO BILATERAL  Result Date: 08/25/2017 CLINICAL DATA:  Screening. EXAM: DIGITAL SCREENING BILATERAL MAMMOGRAM WITH TOMO AND CAD COMPARISON:  Previous exam(s). ACR Breast Density Category b: There are scattered areas of fibroglandular density. FINDINGS: There are no findings suspicious for malignancy. Images were processed with CAD. IMPRESSION: No mammographic evidence of malignancy. A result letter of this screening mammogram will be mailed directly to the patient. RECOMMENDATION: Screening mammogram in one year. (Code:SM-B-01Y) BI-RADS CATEGORY  1: Negative. Electronically Signed   By: Kathryn Salas M.D.   On: 08/25/2017 12:46   MS DIGITAL SCREENING TOMO  BILATERAL  Result Date: 02/29/2016 CLINICAL DATA:  Screening. EXAM: 2D DIGITAL SCREENING BILATERAL MAMMOGRAM WITH CAD AND ADJUNCT TOMO COMPARISON:  None. ACR Breast Density Category b: There are scattered areas of fibroglandular density. FINDINGS: There are no findings suspicious for malignancy. Images were processed with CAD. IMPRESSION: No mammographic evidence of malignancy. A result letter of this screening mammogram will be mailed directly to the patient. RECOMMENDATION: Screening mammogram in one year. (Code:SM-B-01Y) BI-RADS CATEGORY  1: Negative. Electronically Signed   By: Kathryn Salas M.D.   On: 03/25/2016 08:40   Pelvic/Bimanual Patient refused Pap smear today. Patient scheduled Pap smear at [facility] on Tuesday, Jul 25, 2020 at 1315.   Smoking History: Patient has never smoked.   Patient Navigation: Patient education provided. Access to services provided for patient through [facility] program. Spanish interpreter Kathryn Salas from [facility] provided.   Colorectal Cancer Screening: Per patient had a colonoscopy completed in 2013. No complaints today.    Breast and Cervical Cancer Risk Assessment: Patient has family history of a maternal great aunt having breast cancer. Patient has no known genetic mutations, or radiation treatment to the chest before age 85. Patient does not have history of cervical dysplasia, immunocompromised, or DES exposure in-utero.  Risk Assessment    Risk Scores      06/01/2020 02/16/2019   Last edited by: Kathryn Rutherford, LPN Kathryn Salas, Kathryn Grippe, RN   5-year risk: 1.1 % 1 %   Lifetime risk: 8.2 % 8.5 %          A: BCCCP exam without pap smear No complaints.  P: Referred [facility] to the [facility] for a screening mammogram on mobile unit. Appointment scheduled Thursday, June 01, 2020 at  1330Priscille Heidelberg, RN 06/01/2020 12:57 PM

## 2020-06-01 NOTE — Patient Instructions (Signed)
Explained breast self awareness with [name]. Pap smear due but patient refused Pap smear today. Patient scheduled Pap smear at [facility] on Tuesday, Jul 25, 2020 at 1315. Let her know BCCCP will cover Pap smears every 3 years unless has a history of abnormal Pap smears. Referred patient to the [facility] for a screening mammogram on mobile unit. Appointment scheduled Thursday, June 01, 2020 at 1330. Patient escorted to mobile unit following BCCCP appointment for her screening mammogram. Patient aware of appointment for Pap smear and will be there. Let patient know the Breast Center will follow up with her within the next couple weeks with results of her mammogram by letter or phone. [name] verbalized understanding.  Breann Losano, Kathaleen Maser, RN 12:57 PM

## 2020-07-25 ENCOUNTER — Ambulatory Visit: Payer: No Typology Code available for payment source

## 2020-09-13 ENCOUNTER — Other Ambulatory Visit: Payer: Self-pay

## 2020-09-13 ENCOUNTER — Other Ambulatory Visit: Payer: Self-pay | Admitting: *Deleted

## 2020-09-13 DIAGNOSIS — Z124 Encounter for screening for malignant neoplasm of cervix: Secondary | ICD-10-CM

## 2020-09-13 NOTE — Progress Notes (Signed)
Patient: Kathryn Salas           Date of Birth: 17-Apr-1965           MRN: [id number] Visit Date: 09/13/2020 PCP: Patient, No Pcp Per (Inactive)  Cervical Cancer Screening Do you smoke?: No Have you ever had or been told you have an allergy to latex products?: Yes Marital status: Married Date of last pap smear: 2-5 yrs ago (09/04/16 (Negative) [facility] Date of last menstrual period:  (post-menopausal) Number of pregnancies: 3 Number of births: 3 Have you ever had any of the following? Hysterectomy: No Tubal ligation (tubes tied): No Abnormal bleeding: No Abnormal pap smear: No Venereal warts: No A sex partner with venereal warts: No A high risk* sex partner: No  Cervical Exam  Abnormal Observations: Normal Exam. Recommendations: Last Pap smear was 09/04/2016 at the [facility] and normal. Per patient has no history of an abnormal Pap smear. Last Pap smear result is in Epic. If today's Pap smear is normal and HPV negative next Pap smear is due in 5 years. Informed patient that will call her within the next couple of weeks with results of her Pap smear. Patient verbalized understanding.     Used Spanish interpreter [employer] from [place].  Patient's History Patient Active Problem List   Diagnosis Date Noted   Screening breast examination 02/16/2019   History of colonic polyps 02/22/2016   Gastroesophageal reflux disease 01/04/2016   Chronic back pain 01/04/2016   Past Medical History:  Diagnosis Date   Anxiety    Arthritis    Fatty liver    History of kidney stones    Migraines     Family History  Problem Relation Age of Onset   Hypertension Mother    Depression Mother    Cancer Father        Stomach Cancer   Cancer Maternal Aunt        Breast   Breast cancer Maternal Aunt    Cancer Paternal Grandmother        ovarian cancer    Social History   Occupational History   Not on file  Tobacco Use   Smoking status: Never    Smokeless tobacco: Never  Vaping Use   Vaping Use: Never used  Substance and Sexual Activity   Alcohol use: No   Drug use: No   Sexual activity: Yes    Birth control/protection: Post-menopausal

## 2020-09-14 LAB — CYTOLOGY - PAP
Comment: NEGATIVE
Diagnosis: NEGATIVE
High risk HPV: NEGATIVE

## 2020-09-26 ENCOUNTER — Telehealth: Payer: Self-pay

## 2020-09-26 NOTE — Telephone Encounter (Signed)
Via Natale Lay, Spanish Interpreter [facility]), attempts on 09/19/2020, 09/21/2020, and 09/25/2020 have been made to contact patient regarding lab (pap test) results. Left message on voicemail requesting return call.

## 2020-09-27 ENCOUNTER — Telehealth: Payer: Self-pay

## 2020-09-27 NOTE — Telephone Encounter (Signed)
Via Erika McReynolds, Spanish Interpreter ([employer]), Patient informed negative Pap/HPV results, next pap due in 5 years. Patient verbalized understanding.  

## 2021-01-17 ENCOUNTER — Encounter (HOSPITAL_COMMUNITY): Payer: Self-pay

## 2021-01-17 ENCOUNTER — Emergency Department (HOSPITAL_COMMUNITY)
Admission: EM | Admit: 2021-01-17 | Discharge: 2021-01-17 | Disposition: A | Discharge status: Home / Self Care | Payer: Self-pay | Attending: Emergency Medicine | Admitting: Emergency Medicine

## 2021-01-17 ENCOUNTER — Other Ambulatory Visit: Payer: Self-pay

## 2021-01-17 ENCOUNTER — Emergency Department (HOSPITAL_COMMUNITY): Payer: Self-pay

## 2021-01-17 DIAGNOSIS — J101 Influenza due to other identified influenza virus with other respiratory manifestations: Secondary | ICD-10-CM | POA: Insufficient documentation

## 2021-01-17 DIAGNOSIS — Z20822 Contact with and (suspected) exposure to covid-19: Secondary | ICD-10-CM | POA: Insufficient documentation

## 2021-01-17 DIAGNOSIS — J45909 Unspecified asthma, uncomplicated: Secondary | ICD-10-CM | POA: Insufficient documentation

## 2021-01-17 HISTORY — DX: Unspecified asthma, uncomplicated: J45.909

## 2021-01-17 HISTORY — DX: Pneumonia, unspecified organism: J18.9

## 2021-01-17 LAB — RESP PANEL BY RT-PCR (FLU A&B, COVID) ARPGX2
Influenza A by PCR: POSITIVE — AB
Influenza B by PCR: NEGATIVE
SARS Coronavirus 2 by RT PCR: NEGATIVE

## 2021-01-17 MED ORDER — OSELTAMIVIR PHOSPHATE 75 MG PO CAPS: 75.0000 mg | ORAL_CAPSULE | Freq: Two times a day (BID) | ORAL | 0 refills | Status: DC

## 2021-01-17 MED ORDER — BENZONATATE 100 MG PO CAPS: ORAL_CAPSULE | ORAL | 3 refills | Status: DC

## 2021-01-17 MED ORDER — DEXTROMETHORPHAN-GUAIFENESIN 10-100 MG/5ML PO LIQD: 20.0000 mL | Freq: Four times a day (QID) | ORAL | 0 refills | Status: DC | PRN

## 2021-01-17 NOTE — ED Triage Notes (Signed)
Pt c/o cough, fever, headache, and body aches x3 days. Pt states grandson recently diagnosed with the flu.

## 2021-01-17 NOTE — Discharge Instructions (Addendum)
You have been diagnosed with influenza A.  Take medication prescribed.  Get plenty of rest and drink plenty of fluid.

## 2021-01-17 NOTE — ED Provider Notes (Signed)
[facility] EMERGENCY DEPARTMENT Provider Note   CSN: [id number] Arrival date & time: 01/17/21  [id number]     History Chief Complaint  Patient presents with   Cough    Kathryn Salas is a 55 y.o. female.  The history is provided by the patient. No language interpreter was used.  Cough  55 year old female significant history of asthma, anxiety, who presents with cold symptoms.  Patient is Hispanic speaking, history obtained with the help of her family member who helped translate as per her request.  For the past 2 to 3 days patient has had fever, headache, sore throat, cough, nausea, occasional posttussive emesis, and feeling rundown.  She tries taking Tylenol at home with minimal improvement.  Her grandson recently diagnosed with the flu.  Her daughter is having similar symptoms as well.  She denies wheezing and denies any significant shortness of breath.  Past Medical History:  Diagnosis Date   Anxiety    Arthritis    Asthma    Fatty liver    History of kidney stones    Migraines    Pneumonia     Patient Active Problem List   Diagnosis Date Noted   Screening breast examination 02/16/2019   History of colonic polyps 02/22/2016   Gastroesophageal reflux disease 01/04/2016   Chronic back pain 01/04/2016    History reviewed. No pertinent surgical history.   OB History     Gravida  3   Para      Term      Preterm      AB      Living         SAB      IAB      Ectopic      Multiple      Live Births  3           Family History  Problem Relation Age of Onset   Hypertension Mother    Depression Mother    Cancer Father        Stomach Cancer   Cancer Maternal Aunt        Breast   Breast cancer Maternal Aunt    Cancer Paternal Grandmother        ovarian cancer    Social History   Tobacco Use   Smoking status: Never   Smokeless tobacco: Never  Vaping Use   Vaping Use: Never used  Substance Use Topics   Alcohol use: No   Drug use: No     Home Medications Prior to Admission medications   Medication Sig Start Date End Date Taking? Authorizing Provider  albuterol (PROVENTIL HFA;VENTOLIN HFA) 108 (90 Base) MCG/ACT inhaler Inhale 2 puffs into the lungs every 6 (six) hours as needed for wheezing or shortness of breath. Patient not taking: Reported on 10/18/2017 10/15/17   Jacquelin Hawking, PA-C  benzonatate (TESSALON PERLES) 100 MG capsule 1 - 2 po Q 8 hour prn cough Patient taking differently: Take 200 mg by mouth every 8 (eight) hours as needed. 1 - 2 po Q 8 hour prn cough 10/15/17   Jacquelin Hawking, PA-C  dextromethorphan-guaiFENesin (ROBITUSSIN-DM) 10-100 MG/5ML liquid Take 20 mLs by mouth every 4 (four) hours as needed for cough.    [provider]  doxycycline (VIBRAMYCIN) 100 MG capsule Take 1 capsule (100 mg total) by mouth 2 (two) times daily. One po bid x 7 days 10/18/17   Blane Ohara, MD  famotidine (PEPCID) 20 MG tablet Take 20 mg by mouth 2 (  two) times daily.    [provider]  gabapentin (NEURONTIN) 100 MG capsule 1 po bid prn pain.  Tome una tableta por boca dos veces diarias cuando sea necesario para Chief Technology Officer. 08/25/17   Jacquelin Hawking, PA-C  magic mouthwash w/lidocaine SOLN 5cc ( 1 teaspoon)  swish and swallow q 6 hour prn.  5 mL (1 cucharadita) enjuague y beba cada 6 horas cuando sea necesario. Patient not taking: Reported on 10/18/2017 10/08/17   Jacquelin Hawking, PA-C  naproxen (NAPROSYN) 250 MG tablet Take 1 tablet by mouth daily as needed.     [provider]  ranitidine (ZANTAC) 150 MG tablet Take 150 mg by mouth daily as needed for heartburn.    [provider]  vitamin E 1000 UNIT capsule Take by mouth daily. Not sure of dosage    [provider]    Allergies    Codeine, Morphine and related, and Pamelor [nortriptyline hcl]  Review of Systems   Review of Systems  Respiratory:  Positive for cough.   All other systems reviewed and are negative.  Physical  Exam Updated Vital Signs BP 115/78   Pulse (!) 105   Temp 97.8 F (36.6 C) (Oral)   Resp 20   Ht 5\' 4"  (1.626 m)   Wt 65.8 kg   LMP 03/19/2003 (Approximate)   SpO2 92%   BMI 24.89 kg/m   Physical Exam Vitals and nursing note reviewed.  Constitutional:      General: She is not in acute distress.    Appearance: She is well-developed.  HENT:     Head: Atraumatic.     Nose: Nose normal.     Mouth/Throat:     Mouth: Mucous membranes are moist.  Eyes:     Conjunctiva/sclera: Conjunctivae normal.  Cardiovascular:     Rate and Rhythm: Normal rate and regular rhythm.     Pulses: Normal pulses.     Heart sounds: Normal heart sounds.  Pulmonary:     Effort: Pulmonary effort is normal.     Breath sounds: No wheezing, rhonchi or rales.  Musculoskeletal:     Cervical back: Neck supple.  Skin:    Findings: No rash.  Neurological:     Mental Status: She is alert.  Psychiatric:        Mood and Affect: Mood normal.    ED Results / Procedures / Treatments   Labs (all labs ordered are listed, but only abnormal results are displayed) Labs Reviewed  RESP PANEL BY RT-PCR (FLU A&B, COVID) ARPGX2 - Abnormal; Notable for the following components:      Result Value   Influenza A by PCR POSITIVE (*)    All other components within normal limits    EKG None  Radiology DG Chest Port 1 View  Result Date: 01/17/2021 CLINICAL DATA:  Shortness of breath EXAM: PORTABLE CHEST 1 VIEW COMPARISON:  2019 FINDINGS: The heart size and mediastinal contours are within normal limits. Left basilar atelectasis/scarring. No pleural effusion. The visualized skeletal structures are unremarkable. 4 mm calcific density overlies the right upper quadrant. IMPRESSION: Left basilar atelectasis/scarring. 4 mm calcification overlies right upper quadrant. May reflect a gallstone. Electronically Signed   By: 2020 M.D.   On: 01/17/2021 08:42    Procedures Procedures   Medications Ordered in  ED Medications - No data to display  ED Course  I have reviewed the triage vital signs and the nursing notes.  Pertinent labs & imaging results that were available during  my care of the patient were reviewed by me and considered in my medical decision making (see chart for details).    MDM Rules/Calculators/A&P                           BP 106/68   Pulse 98   Temp 97.8 F (36.6 C) (Oral)   Resp 20   Ht 5\' 4"  (1.626 m)   Wt 65.8 kg   LMP 03/19/2003 (Approximate)   SpO2 93%   BMI 24.89 kg/m   Final Clinical Impression(s) / ED Diagnoses Final diagnoses:  Influenza A    Rx / DC Orders ED Discharge Orders          Ordered    benzonatate (TESSALON PERLES) 100 MG capsule        01/17/21 0948    dextromethorphan-guaiFENesin (ROBITUSSIN-DM) 10-100 MG/5ML liquid  Every 6 hours PRN        01/17/21 0948    oseltamivir (TAMIFLU) 75 MG capsule  Every 12 hours        01/17/21 0948           9:46 AM Patient here with symptoms suggestive of viral infection.  Chest x-ray without signs of pneumonia.  Respiratory panel showing positive influenza A.  Will provide a Tamiflu and home care instruction provided as well.  She is stable for discharge   13/02/22, Fayrene Helper 01/17/21 13/02/22    [id number], MD 01/19/21 [phone]

## 2021-05-07 ENCOUNTER — Other Ambulatory Visit: Payer: Self-pay

## 2021-05-07 DIAGNOSIS — Z1231 Encounter for screening mammogram for malignant neoplasm of breast: Secondary | ICD-10-CM

## 2021-05-25 ENCOUNTER — Ambulatory Visit (HOSPITAL_COMMUNITY): Payer: Self-pay

## 2021-05-25 ENCOUNTER — Inpatient Hospital Stay (HOSPITAL_COMMUNITY): Admission: RE | Admit: 2021-05-25 | Payer: Self-pay | Source: Ambulatory Visit

## 2021-07-05 ENCOUNTER — Ambulatory Visit (HOSPITAL_COMMUNITY)
Admission: RE | Admit: 2021-07-05 | Discharge: 2021-07-05 | Disposition: A | Discharge status: Home / Self Care | Payer: Self-pay | Source: Ambulatory Visit | Attending: Physician Assistant | Admitting: Physician Assistant

## 2021-07-05 ENCOUNTER — Other Ambulatory Visit (HOSPITAL_COMMUNITY): Payer: Self-pay | Admitting: Physician Assistant

## 2021-07-05 DIAGNOSIS — R059 Cough, unspecified: Secondary | ICD-10-CM | POA: Insufficient documentation

## 2021-07-06 ENCOUNTER — Inpatient Hospital Stay (HOSPITAL_COMMUNITY): Payer: Self-pay | Attending: Obstetrics and Gynecology | Admitting: *Deleted

## 2021-07-06 ENCOUNTER — Encounter (HOSPITAL_COMMUNITY): Payer: Self-pay

## 2021-07-06 ENCOUNTER — Other Ambulatory Visit: Payer: Self-pay

## 2021-07-06 ENCOUNTER — Ambulatory Visit (HOSPITAL_COMMUNITY)
Admission: RE | Admit: 2021-07-06 | Discharge: 2021-07-06 | Disposition: A | Discharge status: Home / Self Care | Payer: Self-pay | Source: Ambulatory Visit | Attending: Obstetrics and Gynecology | Admitting: Obstetrics and Gynecology

## 2021-07-06 VITALS — BP 137/74 | Wt 146.2 lb

## 2021-07-06 DIAGNOSIS — Z1211 Encounter for screening for malignant neoplasm of colon: Secondary | ICD-10-CM

## 2021-07-06 DIAGNOSIS — Z1239 Encounter for other screening for malignant neoplasm of breast: Secondary | ICD-10-CM

## 2021-07-06 DIAGNOSIS — Z1231 Encounter for screening mammogram for malignant neoplasm of breast: Secondary | ICD-10-CM | POA: Insufficient documentation

## 2021-07-06 NOTE — Patient Instructions (Signed)
Explained breast self awareness with [name]. Patient did not need a Pap smear today due to last Pap smear and HPV typing was 09/13/2020. Let her know BCCCP will cover Pap smears and HPV typing every 5 years unless has a history of abnormal Pap smears. Referred patient to [facility] for a screening mammogram. Appointment scheduled Friday, July 06, 2021 at 1030. Patient aware of appointment and will be there. Let patient know [facility] will follow up with her within the next couple weeks with results of her mammogram by letter or phone. [name] verbalized understanding. ? ?Kathryn Salas, Kathryn Maser, RN ?10:41 AM ? ? ? ? ?

## 2021-07-06 NOTE — Addendum Note (Signed)
Addended by: Narda Rutherford on: 07/06/2021 10:24 AM ? ? Modules accepted: Orders ? ?

## 2021-07-06 NOTE — Addendum Note (Signed)
Addended by: Narda Rutherford on: 07/06/2021 10:20 AM ? ? Modules accepted: Orders ? ?

## 2021-07-06 NOTE — Progress Notes (Signed)
Kathryn Salas is a 56 y.o. female who presents to [facility] clinic today with no complaints.  ?  ?Pap Smear: Pap smear not completed today. Last Pap smear was 09/13/2020 at the free cervical screening clinic and was normal with negative HPV. Per patient has no history of an abnormal Pap smear. Last Pap smear result is available in Epic. ?  ?Physical exam: ?Breasts ?Breasts symmetrical. No skin abnormalities bilateral breasts. No nipple retraction bilateral breasts. No nipple discharge bilateral breasts. No lymphadenopathy. No lumps palpated bilateral breasts. No complaints of pain or tenderness on exam.     ? ?MS DIGITAL SCREENING TOMO BILATERAL ? ?Result Date: 06/03/2020 ?CLINICAL DATA:  Screening. EXAM: DIGITAL SCREENING BILATERAL MAMMOGRAM WITH TOMOSYNTHESIS AND CAD TECHNIQUE: Bilateral screening digital craniocaudal and mediolateral oblique mammograms were obtained. Bilateral screening digital breast tomosynthesis was performed. The images were evaluated with computer-aided detection. COMPARISON:  Previous exam(s). ACR Breast Density Category b: There are scattered areas of fibroglandular density. FINDINGS: There are no findings suspicious for malignancy. The images were evaluated with computer-aided detection. IMPRESSION: No mammographic evidence of malignancy. A result letter of this screening mammogram will be mailed directly to the patient. RECOMMENDATION: Screening mammogram in one year. (Code:SM-B-01Y) BI-RADS CATEGORY  1: Negative. Electronically Signed   By: Frederico Hamman M.D.   On: 06/03/2020 09:02  ? ?MS DIGITAL SCREENING TOMO BILATERAL ? ?Result Date: 02/16/2019 ?CLINICAL DATA:  Screening. EXAM: DIGITAL SCREENING BILATERAL MAMMOGRAM WITH TOMO AND CAD COMPARISON:  Previous exam(s). ACR Breast Density Category b: There are scattered areas of fibroglandular density. FINDINGS: There are no findings suspicious for malignancy. Images were processed with CAD. IMPRESSION: No mammographic evidence of  malignancy. A result letter of this screening mammogram will be mailed directly to the patient. RECOMMENDATION: Screening mammogram in one year. (Code:SM-B-01Y) BI-RADS CATEGORY  1: Negative. Electronically Signed   By: Sande Brothers M.D.   On: 02/16/2019 15:41  ? ?MS DIGITAL SCREENING TOMO BILATERAL ? ?Result Date: 08/25/2017 ?CLINICAL DATA:  Screening. EXAM: DIGITAL SCREENING BILATERAL MAMMOGRAM WITH TOMO AND CAD COMPARISON:  Previous exam(s). ACR Breast Density Category b: There are scattered areas of fibroglandular density. FINDINGS: There are no findings suspicious for malignancy. Images were processed with CAD. IMPRESSION: No mammographic evidence of malignancy. A result letter of this screening mammogram will be mailed directly to the patient. RECOMMENDATION: Screening mammogram in one year. (Code:SM-B-01Y) BI-RADS CATEGORY  1: Negative. Electronically Signed   By: Harmon Pier M.D.   On: 08/25/2017 12:46   ?  ?Pelvic/Bimanual ?Pap is not indicated today per BCCCP guidelines. ?  ?Smoking History: ?Patient has never smoked. ?  ?Patient Navigation: ?Patient education provided. Access to services provided for patient through [facility] program. Spanish interpreter Lynnell Chad from [facility] provided.  ? ?Colorectal Cancer Screening: ?Per patient had a colonoscopy completed in 2013. FIT Test given to patient to complete. No complaints today.  ?  ?Breast and Cervical Cancer Risk Assessment: ?Patient has family history of a maternal aunt and a maternal great aunt having breast cancer. Patient has no known genetic mutations, or radiation treatment to the chest before age 64. Patient does not have history of cervical dysplasia, immunocompromised, or DES exposure in-utero. ? ?Risk Assessment   ? ? Risk Scores   ? ?   07/06/2021 06/01/2020  ? Last edited by: Narda Rutherford, LPN [place], Fidel Levy, LPN  ? 5-year risk: 1.2 % 1.1 %  ? Lifetime risk: 8.1 % 8.2 %  ? ?  ?  ? ?  ? ? ?  A: ?BCCCP exam without pap smear ?No  complaints. ? ?P: ?Referred [facility] to [facility] for a screening mammogram. Appointment scheduled Friday, July 06, 2021 at 1030. ? ?Priscille Heidelberg, RN ?07/06/2021 10:41 AM   ?

## 2021-07-26 LAB — FECAL OCCULT BLOOD, IMMUNOCHEMICAL

## 2021-08-08 NOTE — Progress Notes (Signed)
Given to [place] already

## 2021-08-08 NOTE — Progress Notes (Signed)
Please call patient FIT test result and mail her another test kit.

## 2021-08-10 ENCOUNTER — Telehealth: Payer: Self-pay

## 2021-08-10 NOTE — Telephone Encounter (Signed)
Via Lavon Paganini, Spanish Interpreter, Patient informed stool specimen for FIT testing was too old to process when received at lab. If prefers, we can mail another FIT test kit to patient. Patient verbalized understanding, and requested to have another FIT test to be mailed to her. FIT test mailed.

## 2021-08-14 ENCOUNTER — Other Ambulatory Visit: Payer: Self-pay

## 2021-08-14 DIAGNOSIS — Z1211 Encounter for screening for malignant neoplasm of colon: Secondary | ICD-10-CM

## 2022-01-09 ENCOUNTER — Ambulatory Visit: Payer: Self-pay | Admitting: Family Medicine

## 2022-01-23 ENCOUNTER — Ambulatory Visit: Payer: Self-pay | Admitting: Family Medicine

## 2022-03-29 ENCOUNTER — Telehealth: Payer: Self-pay | Admitting: Family Medicine

## 2022-03-29 DIAGNOSIS — U071 COVID-19: Secondary | ICD-10-CM

## 2022-03-29 MED ORDER — AZITHROMYCIN 250 MG PO TABS: ORAL_TABLET | ORAL | 0 refills | Status: AC

## 2022-03-29 MED ORDER — BENZONATATE 200 MG PO CAPS: 200.0000 mg | ORAL_CAPSULE | Freq: Two times a day (BID) | ORAL | 0 refills | Status: DC | PRN

## 2022-03-29 MED ORDER — ONDANSETRON HCL 4 MG PO TABS: 4.0000 mg | ORAL_TABLET | Freq: Three times a day (TID) | ORAL | 0 refills | Status: DC | PRN

## 2022-03-29 NOTE — Progress Notes (Signed)
Virtual Visit Consent   [name], you are scheduled for a virtual visit with a [facility] provider today. Just as with appointments in the office, your consent must be obtained to participate. Your consent will be active for this visit and any virtual visit you may have with one of our providers in the next 365 days. If you have a MyChart account, a copy of this consent can be sent to you electronically.  As this is a virtual visit, video technology does not allow for your provider to perform a traditional examination. This may limit your provider's ability to fully assess your condition. If your provider identifies any concerns that need to be evaluated in person or the need to arrange testing (such as labs, EKG, etc.), we will make arrangements to do so. Although advances in technology are sophisticated, we cannot ensure that it will always work on either your end or our end. If the connection with a video visit is poor, the visit may have to be switched to a telephone visit. With either a video or telephone visit, we are not always able to ensure that we have a secure connection.  By engaging in this virtual visit, you consent to the provision of healthcare and authorize for your insurance to be billed (if applicable) for the services provided during this visit. Depending on your insurance coverage, you may receive a charge related to this service.  I need to obtain your verbal consent now. Are you willing to proceed with your visit today? Kathryn Salas has provided verbal consent on 03/29/2022 for a virtual visit (video or telephone). Dellia Nims, FNP  Date: 03/29/2022 8:49 AM  Virtual Visit via Video Note   I, Dellia Nims, connected with  Kathryn Salas  ([id number], Sep 27, 1965) on 03/29/22 at  8:30 AM EST by a video-enabled telemedicine application and verified that I am speaking with the correct person using two identifiers.  Location: Patient: Virtual Visit Location Patient:  Home Provider: Virtual Visit Location Provider: Home Office   I discussed the limitations of evaluation and management by telemedicine and the availability of in person appointments. The patient expressed understanding and agreed to proceed.    History of Present Illness: Kathryn Salas is a 57 y.o. who identifies as a female who was assigned female at birth, and is being seen today for positive covid test today with sx for several days. Cough persists. Her husband is translating. She has a history of pneumonia and is concerned she will catch that again. She is in no distress.   HPI: HPI  Problems:  Patient Active Problem List   Diagnosis Date Noted   Screening breast examination 02/16/2019   History of colonic polyps 02/22/2016   Gastroesophageal reflux disease 01/04/2016   Chronic back pain 01/04/2016    Allergies:  Allergies  Allergen Reactions   Codeine Shortness Of Breath   Morphine And Related Shortness Of Breath   Pamelor [Nortriptyline Hcl]    Medications:  Current Outpatient Medications:    azithromycin (ZITHROMAX) 250 MG tablet, Take 2 tablets on day 1, then 1 tablet daily on days 2 through 5, Disp: 6 tablet, Rfl: 0   benzonatate (TESSALON) 200 MG capsule, Take 1 capsule (200 mg total) by mouth 2 (two) times daily as needed for cough., Disp: 20 capsule, Rfl: 0   ondansetron (ZOFRAN) 4 MG tablet, Take 1 tablet (4 mg total) by mouth every 8 (eight) hours as needed for nausea or vomiting., Disp: 20 tablet, Rfl: 0  benzonatate (TESSALON PERLES) 100 MG capsule, 1 - 2 po Q 8 hour prn cough, Disp: 20 capsule, Rfl: 3   Cholecalciferol 125 MCG (5000 UT) TABS, Take by mouth., Disp: , Rfl:    dextromethorphan-guaiFENesin (ROBITUSSIN-DM) 10-100 MG/5ML liquid, Take 20 mLs by mouth every 6 (six) hours as needed for cough., Disp: 150 mL, Rfl: 0   doxycycline (VIBRAMYCIN) 100 MG capsule, Take 1 capsule (100 mg total) by mouth 2 (two) times daily. One po bid x 7 days, Disp: 14 capsule, Rfl:  0   famotidine (PEPCID) 20 MG tablet, Take 20 mg by mouth 2 (two) times daily., Disp: , Rfl:    gabapentin (NEURONTIN) 100 MG capsule, 1 po bid prn pain.  Tome una tableta por boca dos veces diarias cuando sea necesario para Conservation officer, historic buildings., Disp: 60 capsule, Rfl: 3   magic mouthwash w/lidocaine SOLN, 5cc ( 1 teaspoon)  swish and swallow q 6 hour prn.  5 mL (1 cucharadita) enjuague y beba cada 6 horas cuando sea necesario. (Patient not taking: Reported on 10/18/2017), Disp: 120 mL, Rfl: 1   naproxen (NAPROSYN) 250 MG tablet, Take 1 tablet by mouth daily as needed. , Disp: , Rfl:    oseltamivir (TAMIFLU) 75 MG capsule, Take 1 capsule (75 mg total) by mouth every 12 (twelve) hours., Disp: 10 capsule, Rfl: 0   ranitidine (ZANTAC) 150 MG tablet, Take 150 mg by mouth daily as needed for heartburn., Disp: , Rfl:    vitamin E 1000 UNIT capsule, Take by mouth daily. Not sure of dosage, Disp: , Rfl:   Observations/Objective: Patient is well-developed, well-nourished in no acute distress.  Resting comfortably  at home.  Head is normocephalic, atraumatic.  No labored breathing.  Speech is clear and coherent with logical content.  Patient is alert and oriented at baseline.    Assessment and Plan: 1. COVID  Increase fluids, no milk or dairy, humidifier at night, tylenol or ibuprofen as directed, urgent care if sx worsen.   Follow Up Instructions: I discussed the assessment and treatment plan with the patient. The patient was provided an opportunity to ask questions and all were answered. The patient agreed with the plan and demonstrated an understanding of the instructions.  A copy of instructions were sent to the patient via MyChart unless otherwise noted below.     The patient was advised to call back or seek an in-person evaluation if the symptoms worsen or if the condition fails to improve as anticipated.  Time:  I spent 15 minutes with the patient via telehealth technology discussing the above  problems/concerns.    Dellia Nims, FNP

## 2022-03-29 NOTE — Patient Instructions (Signed)
Quarantine and Isolation Quarantine and isolation refer to local and travel restrictions to protect the public and travelers from contagious diseases that constitute a public health threat. Contagious diseases are diseases that can spread from one person to another. Quarantine and isolation help to protect the public by preventing exposure to people who have or may have a contagious disease. Isolation separates people who are sick with a contagious disease from people who are not sick. Quarantine separates and restricts the movement of people who were exposed to a contagious disease to see if they become sick. You may be put in quarantine or isolation if you have been exposed to or diagnosed with any of the following diseases: Severe acute respiratory syndromes, such as COVID-19. Cholera. Diphtheria. Tuberculosis. Plague. Smallpox. Yellow fever. Viral hemorrhagic fevers, such as [place], Ebola, and [place]. When to quarantine or isolate Follow these rules, whether you have been vaccinated or not: Stay home and isolate from others when you are sick with a contagious disease. Isolate when you test positive for a contagious disease, even if you do not have symptoms. Isolate if you are sick and suspect that you may have a contagious disease. If you suspect that you have a contagious disease, get tested. If your test results are negative, you can end your isolation. If your test results are positive, follow the full isolation recommendations as told by your health care provider or local health authorities. Quarantine and stay away from others when you have been in close contact with someone who has tested positive for a contagious disease. Close contact is defined as being less than 6 ft (1.8 m) away from an infected person for a total of 15 minutes or more over a 24-hour period. Do not go to places where you are unable to wear a mask, such as restaurants and some gyms. Stay home and separate  from others as much as possible. Avoid being around people who may get very sick from the contagious disease that you have. Use a separate bathroom, if possible. Do not travel. For travel guidance, visit the CDC's travel webpage at wwwnc.cdc.gov/travel/ Follow these instructions at home: Medicines  Take over-the-counter and prescription medicines as told by your health care provider. Finish all antibiotic medicine even when you start to feel better. Stay up to date with all your vaccines. Get scheduled vaccines and boosters as recommended by your health care provider. Lifestyle Wear a high-quality mask if you must be around others at home and in public, if recommended. Improve air flow (ventilation) at home to help prevent the disease from spreading to other people, if possible. Do not share personal household items, like cups, towels, and utensils. Practice everyday hygiene and cleaning. General instructions Talk to your health care provider if you have a weakened body defense system (immune system). People with a weakened immune system may have a reduced immune response to vaccines. You may need to follow current prevention measures, including wearing a well-fitting mask, avoiding crowds, and avoiding poorly ventilated indoor places. Monitor symptoms and follow health care provider instructions, which may include resting, drinking fluids, and taking medicines. Follow specific isolation and quarantine recommendations if you are in places that can lead to disease outbreaks, such as correctional and detention facilities, homeless shelters, and cruise ships. Return to your normal activities as told by your health care provider. Ask your health care provider what activities are safe for you. Keep all follow-up visits. This is important. Where to find more information CDC: www.cdc.gov/quarantine/index.html Contact   a health care provider if: You have a fever. You have signs and symptoms that  return or get worse after isolation. Get help right away if: You have difficulty breathing. You have chest pain. These symptoms may be an emergency. Get help right away. Call 911. Do not wait to see if the symptoms will go away. Do not drive yourself to the hospital. Summary Isolation and quarantine help protect the public by preventing exposure to people who have or may have a contagious disease. Isolate when you are sick or when you test positive, even if you do not have symptoms. Quarantine and stay away from others when you have been in close contact with someone who has tested positive for a contagious disease. This information is not intended to replace advice given to you by your health care provider. Make sure you discuss any questions you have with your health care provider. Document Revised: 03/15/2021 Document Reviewed: 02/22/2021 Elsevier Patient Education  2023 [employer] COVID-19 COVID-19, or coronavirus disease 2019, is an infection that is caused by a new (novel) coronavirus called SARS-CoV-2. COVID-19 can cause many symptoms. In some people, the virus may not cause any symptoms. In others, it may cause mild or severe symptoms. Some people with severe infection develop severe disease. What are the causes? This illness is caused by a virus. The virus may be in the air as tiny specks of fluid (aerosols) or droplets, or it may be on surfaces. You may catch the virus by: Breathing in droplets from an infected person. Droplets can be spread by a person breathing, speaking, singing, coughing, or sneezing. Touching something, like a table or a doorknob, that has virus on it (is contaminated) and then touching your mouth, nose, or eyes. What increases the risk? Risk for infection: You are more likely to get infected with the COVID-19 virus if: You are within 6 ft (1.8 m) of a person with COVID-19 for 15 minutes or longer. You are providing care for a person who is infected with  COVID-19. You are in close personal contact with other people. Close personal contact includes hugging, kissing, or sharing eating or drinking utensils. Risk for serious illness caused by COVID-19: You are more likely to get seriously ill from the COVID-19 virus if: You have cancer. You have a long-term (chronic) disease, such as: Chronic lung disease. This includes pulmonary embolism, chronic obstructive pulmonary disease, and cystic fibrosis. Long-term disease that lowers your body's ability to fight infection (immunocompromise). Serious cardiac conditions, such as heart failure, coronary artery disease, or cardiomyopathy. Diabetes. Chronic kidney disease. Liver diseases. These include cirrhosis, nonalcoholic fatty liver disease, alcoholic liver disease, or autoimmune hepatitis. You have obesity. You are pregnant or were recently pregnant. You have sickle cell disease. What are the signs or symptoms? Symptoms of this condition can range from mild to severe. Symptoms may appear any time from 2 to 14 days after being exposed to the virus. They include: Fever or chills. Shortness of breath or trouble breathing. Feeling tired or very tired. Headaches, body aches, or muscle aches. Runny or stuffy nose, sneezing, coughing, or sore throat. New loss of taste or smell. This is rare. Some people may also have stomach problems, such as nausea, vomiting, or diarrhea. Other people may not have any symptoms of COVID-19. How is this diagnosed? This condition may be diagnosed by testing samples to check for the COVID-19 virus. The most common tests are the PCR test and the antigen test. Tests may be done   in the lab or at home. They include: Using a swab to take a sample of fluid from the back of your nose and throat (nasopharyngeal fluid), from your nose, or from your throat. Testing a sample of saliva from your mouth. Testing a sample of coughed-up mucus from your lungs (sputum). How is this  treated? Treatment for COVID-19 infection depends on the severity of the condition. Mild symptoms can be managed at home with rest, fluids, and over-the-counter medicines. Serious symptoms may be treated in a hospital intensive care unit (ICU). Treatment in the ICU may include: Supplemental oxygen. Extra oxygen is given through a tube in the nose, a face mask, or a hood. Medicines. These may include: Antivirals, such as monoclonal antibodies. These help your body fight off certain viruses that can cause disease. Anti-inflammatories, such as corticosteroids. These reduce inflammation and suppress the immune system. Antithrombotics. These prevent or treat blood clots, if they develop. Convalescent plasma. This helps boost your immune system, if you have an underlying immunosuppressive condition or are getting immunosuppressive treatments. Prone positioning. This means you will lie on your stomach. This helps oxygen to get into your lungs. Infection control measures. If you are at risk for more serious illness caused by COVID-19, your health care provider may prescribe two long-acting monoclonal antibodies, given together every 6 months. How is this prevented? To protect yourself: Use preventive medicine (pre-exposure prophylaxis). You may get pre-exposure prophylaxis if you have moderate or severe immunocompromise. Get vaccinated. Anyone 6 months old or older who meets guidelines can get a COVID-19 vaccine or vaccine series. This includes people who are pregnant or making breast milk (lactating). Get an added dose of COVID-19 vaccine after your first vaccine or vaccine series if you have moderate to severe immunocompromise. This applies if you have had a solid organ transplant or have been diagnosed with an immunocompromising condition. You should get the added dose 4 weeks after you got the first COVID-19 vaccine or vaccine series. If you get an mRNA vaccine, you will need a 3-dose primary  series. If you get the J&J/Janssen vaccine, you will need a 2-dose primary series, with the second dose being an mRNA vaccine. Talk to your health care provider about getting experimental monoclonal antibodies. This treatment is approved under emergency use authorization to prevent severe illness before or after being exposed to the COVID-19 virus. You may be given monoclonal antibodies if: You have moderate or severe immunocompromise. This includes treatments that lower your immune response. People with immunocompromise may not develop protection against COVID-19 when they are vaccinated. You cannot be vaccinated. You may not get a vaccine if you have a severe allergic reaction to the vaccine or its components. You are not fully vaccinated. You are in a facility where COVID-19 is present and: Are in close contact with a person who is infected with the COVID-19 virus. Are at high risk of being exposed to the COVID-19 virus. You are at risk of illness from new variants of the COVID-19 virus. To protect others: If you have symptoms of COVID-19, take steps to prevent the virus from spreading to others. Stay home. Leave your house only to get medical care. Do not use public transit, if possible. Do not travel while you are sick. Wash your hands often with soap and water for at least 20 seconds. If soap and water are not available, use alcohol-based hand sanitizer. Make sure that all people in your household wash their hands well and often. Cough or sneeze   into a tissue or your sleeve or elbow. Do not cough or sneeze into your hand or into the air. Where to find more information Centers for Disease Control and Prevention: www.cdc.gov/coronavirus World Health Organization: www.who.int/health-topics/coronavirus Get help right away if: You have trouble breathing. You have pain or pressure in your chest. You are confused. You have bluish lips and fingernails. You have trouble waking from sleep. You  have symptoms that get worse. These symptoms may be an emergency. Get help right away. Call 911. Do not wait to see if the symptoms will go away. Do not drive yourself to the hospital. Summary COVID-19 is an infection that is caused by a new coronavirus. Sometimes, there are no symptoms. Other times, symptoms range from mild to severe. Some people with a severe COVID-19 infection develop severe disease. The virus that causes COVID-19 can spread from person to person through droplets or aerosols from breathing, speaking, singing, coughing, or sneezing. Mild symptoms of COVID-19 can be managed at home with rest, fluids, and over-the-counter medicines. This information is not intended to replace advice given to you by your health care provider. Make sure you discuss any questions you have with your health care provider. Document Revised: 02/20/2021 Document Reviewed: 02/22/2021 Elsevier Patient Education  2023 [employer]  

## 2022-06-02 ENCOUNTER — Ambulatory Visit
Admission: EM | Admit: 2022-06-02 | Discharge: 2022-06-02 | Disposition: A | Discharge status: Home / Self Care | Payer: Self-pay

## 2022-06-02 ENCOUNTER — Ambulatory Visit (INDEPENDENT_AMBULATORY_CARE_PROVIDER_SITE_OTHER): Payer: Self-pay

## 2022-06-02 DIAGNOSIS — R0602 Shortness of breath: Secondary | ICD-10-CM

## 2022-06-02 DIAGNOSIS — R059 Cough, unspecified: Secondary | ICD-10-CM

## 2022-06-02 DIAGNOSIS — J069 Acute upper respiratory infection, unspecified: Secondary | ICD-10-CM

## 2022-06-02 MED ORDER — PSEUDOEPH-BROMPHEN-DM 30-2-10 MG/5ML PO SYRP: 5.0000 mL | ORAL_SOLUTION | Freq: Four times a day (QID) | ORAL | 0 refills | Status: DC | PRN

## 2022-06-02 MED ORDER — PREDNISONE 20 MG PO TABS: 40.0000 mg | ORAL_TABLET | Freq: Every day | ORAL | 0 refills | Status: AC

## 2022-06-02 NOTE — ED Provider Notes (Signed)
[facility]    CSN: [id number] Arrival date & time: 06/02/22  0800      History   Chief Complaint Chief Complaint  Patient presents with   Cough    HPI Kathryn Salas is a 57 y.o. female.   The history is provided by the patient.   The patient presents with a 1 week history of cough, chest tightness, and shortness of breath.  Patient also complains of sore throat.  She states that she has a history of pneumonia, has had pneumonia 4 times per her report.  She denies fever, chills, headache, ear pain, wheezing, difficulty breathing, abdominal pain, nausea, vomiting, or diarrhea.  Patient denies history of seasonal allergies.  She does endorse a history of asthma.  She states that she does have an albuterol inhaler, but does not like to use it because it makes her "nervous or jittery".  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  Patient with recent history of COVID in January. Past Medical History:  Diagnosis Date   Anxiety    Arthritis    Asthma    Fatty liver    History of kidney stones    Migraines    Pneumonia     Patient Active Problem List   Diagnosis Date Noted   Screening breast examination 02/16/2019   History of colonic polyps 02/22/2016   Gastroesophageal reflux disease 01/04/2016   Chronic back pain 01/04/2016    History reviewed. No pertinent surgical history.  OB History     Gravida  3   Para      Term      Preterm      AB      Living         SAB      IAB      Ectopic      Multiple      Live Births  3            Home Medications    Prior to Admission medications   Medication Sig Start Date End Date Taking? Authorizing Provider  brompheniramine-pseudoephedrine-DM 30-2-10 MG/5ML syrup Take 5 mLs by mouth 4 (four) times daily as needed. 06/02/22  Yes Connery Shiffler-Warren, Alda Lea, NP  ibuprofen (ADVIL) 800 MG tablet Take by mouth. 12/28/19  Yes [provider]  prednisoLONE acetate (PRED FORTE) 1 % ophthalmic suspension Place into the right eye. 05/31/22  Yes [provider]  predniSONE (DELTASONE) 20 MG tablet Take 2 tablets (40 mg total) by mouth daily with breakfast for 5 days. 06/02/22 06/07/22 Yes Traveon Louro-Warren, Alda Lea, NP  sulfamethoxazole-trimethoprim (BACTRIM DS) 800-160 MG tablet Take 1 tablet by mouth daily. 05/20/22  Yes [provider]  traZODone (DESYREL) 100 MG tablet Take 50 mg by mouth at bedtime. 04/24/22  Yes [provider]  benzonatate (TESSALON PERLES) 100 MG capsule 1 - 2 po Q 8 hour prn cough 01/17/21   Domenic Moras, PA-C  benzonatate (TESSALON) 200 MG capsule Take 1 capsule (200 mg total) by mouth 2 (two) times daily as needed for cough. 03/29/22   Tempie Hoist, FNP  Cholecalciferol 125 MCG (5000 UT) TABS Take by mouth.    [provider]  dextromethorphan-guaiFENesin (ROBITUSSIN-DM) 10-100 MG/5ML liquid Take 20 mLs by mouth every 6 (six) hours as needed for cough. 01/17/21   Domenic Moras, PA-C  doxycycline (VIBRAMYCIN) 100 MG capsule Take 1 capsule (100 mg total) by  mouth 2 (two) times daily. One po bid x 7 days 10/18/17   Elnora Morrison, MD  famotidine (PEPCID) 20 MG tablet Take 20 mg by mouth 2 (two) times daily.    [provider]  gabapentin (NEURONTIN) 100 MG capsule 1 po bid prn pain.  Tome una tableta por boca dos veces diarias cuando sea necesario para Conservation officer, historic buildings. 08/25/17   Soyla Dryer, PA-C  magic mouthwash w/lidocaine SOLN 5cc ( 1 teaspoon)  swish and swallow q 6 hour prn.  5 mL (1 cucharadita) enjuague y beba cada 6 horas cuando sea necesario. Patient not taking: Reported on 10/18/2017 10/08/17   Soyla Dryer, PA-C  naproxen (NAPROSYN) 250 MG tablet Take 1 tablet by mouth daily as needed.     [provider]  ondansetron (ZOFRAN) 4 MG tablet Take 1 tablet (4 mg total) by mouth every 8 (eight) hours as needed for nausea or vomiting. 03/29/22   Tempie Hoist,  FNP  oseltamivir (TAMIFLU) 75 MG capsule Take 1 capsule (75 mg total) by mouth every 12 (twelve) hours. 01/17/21   Domenic Moras, PA-C  ranitidine (ZANTAC) 150 MG tablet Take 150 mg by mouth daily as needed for heartburn.    [provider]  vitamin E 1000 UNIT capsule Take by mouth daily. Not sure of dosage    [provider]    Family History Family History  Problem Relation Age of Onset   Hypertension Mother    Depression Mother    Cancer Father        Stomach Cancer   Cancer Maternal Aunt        Breast   Breast cancer Maternal Aunt    Cancer Paternal Grandmother        ovarian cancer    Social History Social History   Tobacco Use   Smoking status: Never   Smokeless tobacco: Never  Vaping Use   Vaping Use: Never used  Substance Use Topics   Alcohol use: No   Drug use: No     Allergies   Codeine, Morphine and related, and Pamelor [nortriptyline hcl]   Review of Systems Review of Systems Per HPI  Physical Exam Triage Vital Signs ED Triage Vitals  Enc Vitals Group     BP 06/02/22 0808 127/78     Pulse Rate 06/02/22 0808 87     Resp 06/02/22 0808 16     Temp 06/02/22 0808 98.6 F (37 C)     Temp Source 06/02/22 0808 Oral     SpO2 06/02/22 0808 96 %     Weight --      Height --      Head Circumference --      Peak Flow --      Pain Score 06/02/22 0812 0     Pain Loc --      Pain Edu? --      Excl. in [place]? --    No data found.  Updated Vital Signs BP 127/78 (BP Location: Right Arm)   Pulse 87   Temp 98.6 F (37 C) (Oral)   Resp 16   LMP 03/19/2003 (Approximate)   SpO2 96%   Visual Acuity Right Eye Distance:   Left Eye Distance:   Bilateral Distance:    Right Eye Near:   Left Eye Near:    Bilateral Near:     Physical Exam Vitals and nursing note reviewed.  Constitutional:      General: She is not in acute distress.  Appearance: Normal appearance. She is well-developed.  HENT:     Head: Normocephalic.     Right Ear:  Tympanic membrane, ear canal and external ear normal.     Left Ear: Tympanic membrane, ear canal and external ear normal.     Nose: Nose normal.     Right Turbinates: Enlarged and swollen.     Left Turbinates: Enlarged and swollen.     Right Sinus: No maxillary sinus tenderness or frontal sinus tenderness.     Left Sinus: No maxillary sinus tenderness or frontal sinus tenderness.     Mouth/Throat:     Lips: Pink.     Mouth: Mucous membranes are moist.     Pharynx: Oropharynx is clear. Uvula midline. Posterior oropharyngeal erythema present. No pharyngeal swelling.  Eyes:     Extraocular Movements: Extraocular movements intact.     Conjunctiva/sclera: Conjunctivae normal.     Pupils: Pupils are equal, round, and reactive to light.  Cardiovascular:     Rate and Rhythm: Normal rate and regular rhythm.     Pulses: Normal pulses.     Heart sounds: Normal heart sounds.  Pulmonary:     Effort: Pulmonary effort is normal. No respiratory distress.     Breath sounds: Normal breath sounds. No stridor. No wheezing, rhonchi or rales.  Abdominal:     General: Bowel sounds are normal. There is no distension.     Palpations: Abdomen is soft.     Tenderness: There is no abdominal tenderness. There is no guarding or rebound.  Genitourinary:    Vagina: Normal. No vaginal discharge.  Musculoskeletal:     Cervical back: Normal range of motion.  Lymphadenopathy:     Cervical: No cervical adenopathy.  Skin:    General: Skin is warm and dry.     Findings: No erythema or rash.  Neurological:     General: No focal deficit present.     Mental Status: She is alert and oriented to person, place, and time.     Cranial Nerves: No cranial nerve deficit.  Psychiatric:        Mood and Affect: Mood normal.        Behavior: Behavior normal.      UC Treatments / Results  Labs (all labs ordered are listed, but only abnormal results are displayed) Labs Reviewed - No data to  display  EKG   Radiology DG Chest 2 View  Result Date: 06/02/2022 CLINICAL DATA:  Cough and shortness of breath EXAM: CHEST - 2 VIEW COMPARISON:  07/05/2021, 01/17/2021 FINDINGS: Improvement in the bibasilar aeration compatible with resolving atelectasis. Chronic basilar scarring remains. No definite superimposed pneumonia, collapse or consolidation. No enlarging effusion or pneumothorax. Trachea midline. Normal heart size and vascularity. No acute osseous finding. IMPRESSION: Improvement in bibasilar aeration compatible with resolving atelectasis. Electronically Signed   By: Jerilynn Mages.  Shick M.D.   On: 06/02/2022 08:39    Procedures Procedures (including critical care time)  Medications Ordered in UC Medications - No data to display  Initial Impression / Assessment and Plan / UC Course  I have reviewed the triage vital signs and the nursing notes.  Pertinent labs & imaging results that were available during my care of the patient were reviewed by me and considered in my medical decision making (see chart for details).  Patient is well-appearing, she is in no acute distress, vital signs are stable.  Chest x-ray does not show pneumonia or other cardiopulmonary disease.  Cough most likely related to a  viral upper respiratory infection versus bronchitis.  Will treat patient with prednisone 40 mg for the next 5 days for bronchial inflammation and Bromfed-DM for her cough.  Patient has an albuterol inhaler, have her continue use of that as needed.  Education was also provided regarding use of albuterol inhaler.  Discussed viral etiology with the patient and her spouse, understanding was verbalized.  Patient was given indications of when follow-up may be necessary.  Patient and spouse verbalized understanding.  All questions were answered.  Patient stable for discharge.  Final Clinical Impressions(s) / UC Diagnoses   Final diagnoses:  Viral upper respiratory tract infection with cough      Discharge Instructions      The chest x-ray did not show pneumonia.  The chest x-ray does show that you need to cough and deep breathe to prevent the development of pneumonia. Take medication as prescribed.  Recommend use of the albuterol inhaler that you have every 6 hours as needed for wheezing, chest tightness, or shortness of breath.  Do not use more frequently than every 6 hours. Increase fluids and allow for plenty of rest. Recommend the use of a humidifier in the bedroom at nighttime during sleep and sleeping elevated on pillows while cough symptoms persist. Warm salt water gargles 3-4 times daily to help with throat pain or discomfort. Please be advised that a viral infection can last from 7 to 14 days.  If your symptoms suddenly worsen before that time, or extend beyond that time, please follow-up in this clinic or with your primary care physician for further evaluation. Follow-up as needed.     ED Prescriptions     Medication Sig Dispense Auth. Provider   predniSONE (DELTASONE) 20 MG tablet Take 2 tablets (40 mg total) by mouth daily with breakfast for 5 days. 10 tablet Sherrise Liberto-Warren, Alda Lea, NP   brompheniramine-pseudoephedrine-DM 30-2-10 MG/5ML syrup Take 5 mLs by mouth 4 (four) times daily as needed. 140 mL Kenedie Dirocco-Warren, Alda Lea, NP      PDMP not reviewed this encounter.   Tish Men, NP 06/02/22 [phone]

## 2022-06-02 NOTE — ED Triage Notes (Signed)
Pt reports cough and congestion x 1 week., Dayquil, Nyquil, benzonatate gives no relief.  Pt reports history of pneumonia. Reports she had COVID on January 2024.

## 2022-06-02 NOTE — Discharge Instructions (Signed)
The chest x-ray did not show pneumonia.  The chest x-ray does show that you need to cough and deep breathe to prevent the development of pneumonia. Take medication as prescribed.  Recommend use of the albuterol inhaler that you have every 6 hours as needed for wheezing, chest tightness, or shortness of breath.  Do not use more frequently than every 6 hours. Increase fluids and allow for plenty of rest. Recommend the use of a humidifier in the bedroom at nighttime during sleep and sleeping elevated on pillows while cough symptoms persist. Warm salt water gargles 3-4 times daily to help with throat pain or discomfort. Please be advised that a viral infection can last from 7 to 14 days.  If your symptoms suddenly worsen before that time, or extend beyond that time, please follow-up in this clinic or with your primary care physician for further evaluation. Follow-up as needed.

## 2022-06-27 ENCOUNTER — Other Ambulatory Visit (HOSPITAL_COMMUNITY): Payer: Self-pay | Admitting: Obstetrics and Gynecology

## 2022-06-27 DIAGNOSIS — Z1231 Encounter for screening mammogram for malignant neoplasm of breast: Secondary | ICD-10-CM

## 2022-07-03 ENCOUNTER — Encounter: Payer: Self-pay | Admitting: Internal Medicine

## 2022-07-03 ENCOUNTER — Ambulatory Visit (INDEPENDENT_AMBULATORY_CARE_PROVIDER_SITE_OTHER): Payer: Self-pay | Admitting: Internal Medicine

## 2022-07-03 VITALS — BP 140/78 | HR 76 | Temp 97.7°F | Ht 64.0 in | Wt 150.4 lb

## 2022-07-03 DIAGNOSIS — R0982 Postnasal drip: Secondary | ICD-10-CM

## 2022-07-03 DIAGNOSIS — J320 Chronic maxillary sinusitis: Secondary | ICD-10-CM

## 2022-07-03 DIAGNOSIS — J189 Pneumonia, unspecified organism: Secondary | ICD-10-CM

## 2022-07-03 DIAGNOSIS — Z758 Other problems related to medical facilities and other health care: Secondary | ICD-10-CM

## 2022-07-03 DIAGNOSIS — R053 Chronic cough: Secondary | ICD-10-CM

## 2022-07-03 DIAGNOSIS — Z860100 Personal history of colon polyps, unspecified: Secondary | ICD-10-CM

## 2022-07-03 DIAGNOSIS — U099 Post covid-19 condition, unspecified: Secondary | ICD-10-CM

## 2022-07-03 DIAGNOSIS — Z603 Acculturation difficulty: Secondary | ICD-10-CM

## 2022-07-03 DIAGNOSIS — Z1211 Encounter for screening for malignant neoplasm of colon: Secondary | ICD-10-CM

## 2022-07-03 DIAGNOSIS — H6993 Unspecified Eustachian tube disorder, bilateral: Secondary | ICD-10-CM

## 2022-07-03 DIAGNOSIS — Z8709 Personal history of other diseases of the respiratory system: Secondary | ICD-10-CM

## 2022-07-03 DIAGNOSIS — Z119 Encounter for screening for infectious and parasitic diseases, unspecified: Secondary | ICD-10-CM

## 2022-07-03 DIAGNOSIS — J4541 Moderate persistent asthma with (acute) exacerbation: Secondary | ICD-10-CM

## 2022-07-03 DIAGNOSIS — J309 Allergic rhinitis, unspecified: Secondary | ICD-10-CM | POA: Insufficient documentation

## 2022-07-03 HISTORY — DX: Pneumonia, unspecified organism: J18.9

## 2022-07-03 HISTORY — DX: Postnasal drip: R09.82

## 2022-07-03 MED ORDER — LEVALBUTEROL TARTRATE 45 MCG/ACT IN AERO: 1.0000 | INHALATION_SPRAY | RESPIRATORY_TRACT | 12 refills | Status: DC | PRN

## 2022-07-03 MED ORDER — SIMPLY SALINE 0.9 % NA AERS: 2.0000 | INHALATION_SPRAY | NASAL | 11 refills | Status: DC

## 2022-07-03 MED ORDER — LORATADINE 10 MG PO TABS
10.0000 mg | ORAL_TABLET | Freq: Every day | ORAL | 11 refills | Status: AC
Start: 2022-07-03 — End: ?

## 2022-07-03 MED ORDER — AMOXICILLIN-POT CLAVULANATE 875-125 MG PO TABS: 1.0000 | ORAL_TABLET | Freq: Two times a day (BID) | ORAL | 0 refills | Status: DC

## 2022-07-03 MED ORDER — FLUTICASONE PROPIONATE 50 MCG/ACT NA SUSP: 2.0000 | Freq: Every day | NASAL | 6 refills | Status: AC

## 2022-07-03 NOTE — Patient Instructions (Addendum)
This shows  how the eustachian canal works to drain the inner ear and how it is connected to the nasopharynx.  Nasal sinus rinses with saline nasal mist sprays can rinse all of the pollen and allergens and other irritants and pathogens out of his sinuses and nasopharynx, reducing the plugging/swelling around the eustachian tube.  The sinus rinse clears away the mucus and allows nasal steroid sprays to help reach the eustachian tube and reduce swellling to open it up.    Basic Sinus Care: Mist each nostril nightly with sterile saline nasal mist, then spray each nostril immediately after with fluticasone nasal spray (Flonase) or other steroid or antihistamine nasal pray Next, if symptoms persist, add a daily allergy pill.  Benadryl is the strongest but will make you very drowsy so only take when you can sleep after.  Ear Pressure Relief Maneuvers: Step 1 If the congestion is mild, you can often use simple maneuvers to quickly alter the pressure in your middle ear, such as: Swallowing Yawning Chewing gum Sucking on hard candy Similar methods can be used on children. If traveling with an infant or toddler, try giving them a bottle, pacifier, or something to drink or suck on.  Warm compress: Applying a warm, moist cloth to the back of your ear can help reduce swelling and help drain congested passages. In some cases, these interventions will cause the ears will pop without trying. If they don't, give it 20 minutes and see if swallowing, yawning, chewing gum, or sucking on hard candy helps.  Step 2 If these methods alone don't help, you can try other interventions like:  Decongestants: OTC drugs like Afrin (oxymetazoline) or Sudafed (pseudoephedrine) work by reducing the swelling of blood vessels in the nasal passages and Eustachian tubes.  Never use these medications for more than a few days at a time, especially afrin is dependency-forming  If this isn't working or you need more than a few days  of afrin or sudafed... you should make an appointment.   Step 3 (just for mod severe ear pressure and pain) If these interventions don't help, there are three other advanced strategies you can try called the Valsalva maneuver, the Toynbee maneuver, and the Frenzel maneuver.  Advanced Strategy 1:  The Valsalva maneuver Inhale. Pinch your nose shut with your fingers. Keeping your lips tightly shut, blow out forcefully as if you are blowing up a balloon. To increase the pressure, try bearing down as if having a bowel movement.  Advanced Strategy 2:  The Toynbee maneuver The Toynbee maneuver may also be safer than the Valsalva maneuver if you've had a previous eardrum injury. The Valsalva method exerts much more pressure on the eardrum and can possibly cause a rupture if you blow too forcefully.  Keep your mouth tightly shut. Pinch your nose shut with your fingers. Swallow hard.  Advanced Strategy 3: The Frenzel maneuver Pinch your nose shut with your fingers Close your mouth and place the tip of your tongue behind your upper front teeth. Push the back of your tongue to the roof of your mouth as if making a hard "G" or "K" sound. The back of your tongue will touch the roof. While doing this, close your vocal folds at the back of your throat and lift your larynx (voice box) up to push the air out of your mouth and into your nose.  ------------------------------------------------------------------------ If all this fails despite extensive efforts then you need to go to an ear nose and throat for   surgical correction - but this should not be tried until everything else fails.      

## 2022-07-03 NOTE — Assessment & Plan Note (Deleted)
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

## 2022-07-04 ENCOUNTER — Encounter: Payer: Self-pay | Admitting: Internal Medicine

## 2022-07-04 DIAGNOSIS — H6993 Unspecified Eustachian tube disorder, bilateral: Secondary | ICD-10-CM | POA: Insufficient documentation

## 2022-07-04 DIAGNOSIS — Z603 Acculturation difficulty: Secondary | ICD-10-CM | POA: Insufficient documentation

## 2022-07-04 DIAGNOSIS — U099 Post covid-19 condition, unspecified: Secondary | ICD-10-CM | POA: Insufficient documentation

## 2022-07-04 HISTORY — DX: Post covid-19 condition, unspecified: U09.9

## 2022-07-04 NOTE — Assessment & Plan Note (Signed)
Patient agreed to Cologuard after brief discussion risks and benefits, insurance  is currently self pay so report colonoscopy likely not affordable

## 2022-07-04 NOTE — Assessment & Plan Note (Signed)
We need to get a pneumonia vaccine I asked her to return in a few weeks I prefer to do it when she is not so ill flaring from allergies already

## 2022-07-04 NOTE — Assessment & Plan Note (Signed)
I only speak english, but I tried to use the little spanish I know where I can.  We had excellent translator but its possible some of my education was not fully understood I put AVS info in english and spanish for her, she requested it in english to help her learn

## 2022-07-04 NOTE — Assessment & Plan Note (Signed)
See AVS for efforts made to explain this condition Medications prescribed to help her manage

## 2022-07-04 NOTE — Assessment & Plan Note (Signed)
Advised her to switch from her cleaning protocol plans from.  I explained that I did not think this was the cause of her symptoms at the end of the visit but she has Advair already and I wanted her to also have Xopenex available just in case as a rescue inhaler for her asthma

## 2022-07-04 NOTE — Progress Notes (Signed)
[facility]  Phone: [phone]  New patient visit  Visit Date: 07/03/2022 Patient: Kathryn Salas   DOB: August 20, 1965   57 y.o. Female  MRN: [id number] PCP:  Kathryn Olszewski, MD  (establishing care today)  Today's Health Care Provider: Lula Olszewski, MD   Assessment and Plan:   This initial visit focused on establishing a primary care relationship and gathering a comprehensive medical history. We addressed key concerns and prioritized next steps. For any remaining concerns, we encourage Kathryn Salas to schedule a follow-up appointment at her earliest convenience.   To further complete the medical history and optimize health maintenance, we also recommend scheduling an Annual Comprehensive Physical Exam (CPE) also known as Preventive Medicine Visit.  Allergic rhinitis, unspecified seasonality, unspecified trigger Assessment & Plan: This is my primary diagnosis for today that in my medical opinion best explains the patient's reason for visit of chronic cough, post nasal drip,evolving to chest congestion I prefer this diagnosis to asthma due to lack of wheezing, and marked sinus inflammation and tympanic membrane bulging was observed I advised patient to treat this as severe allergic sinusitis primarily, and remove plants from her home, and stop using bleach for cleaning   Colon cancer screening -     Cologuard  Screening examination for infectious disease  Recurrent pneumonia  Moderate persistent asthma with exacerbation -     Fluticasone Propionate; Place 2 sprays into both nostrils daily. Use after simply saline sinus rinse at night  Dispense: 16 g; Refill: 6 -     Simply Saline; Place 2 each into the nose as directed. Use nightly for sinus hygiene long-term.  Can also be used as many times daily as desired to assist with clearing congested sinuses.  Dispense: 127 mL; Refill: 11 -     Loratadine; Take 1 tablet (10 mg total) by mouth daily.  Dispense: 30 tablet;  Refill: 11 -     Amoxicillin-Pot Clavulanate; Take 1 tablet by mouth 2 (two) times daily. Solo tome si tiene pus fiebre o dolor.  Deje de tomar bactrim mientras.  Dispense: 20 tablet; Refill: 0 -     Levalbuterol Tartrate; Inhale 1 puff into the lungs every 4 (four) hours as needed for wheezing. Take as rescue inhaler for shortness of breath or wheezing.  Dispense: 1 each; Refill: 12  Post-nasal drip -     Fluticasone Propionate; Place 2 sprays into both nostrils daily. Use after simply saline sinus rinse at night  Dispense: 16 g; Refill: 6 -     Simply Saline; Place 2 each into the nose as directed. Use nightly for sinus hygiene long-term.  Can also be used as many times daily as desired to assist with clearing congested sinuses.  Dispense: 127 mL; Refill: 11 -     Loratadine; Take 1 tablet (10 mg total) by mouth daily.  Dispense: 30 tablet; Refill: 11 -     Amoxicillin-Pot Clavulanate; Take 1 tablet by mouth 2 (two) times daily. Solo tome si tiene pus fiebre o dolor.  Deje de tomar bactrim mientras.  Dispense: 20 tablet; Refill: 0 -     Levalbuterol Tartrate; Inhale 1 puff into the lungs every 4 (four) hours as needed for wheezing. Take as rescue inhaler for shortness of breath or wheezing.  Dispense: 1 each; Refill: 12  Chronic maxillary sinusitis  History of asthma Assessment & Plan: Advised her to switch from her cleaning protocol plans from.  I explained that I did not think this was  the cause of her symptoms at the end of the visit but she has Advair already and I wanted her to also have Xopenex available just in case as a rescue inhaler for her asthma   ETD (Eustachian tube dysfunction), bilateral Assessment & Plan: See AVS for efforts made to explain this condition Medications prescribed to help her manage    History of colonic polyps Assessment & Plan: Patient agreed to Cologuard after brief discussion risks and benefits, insurance  is currently self pay so report colonoscopy  likely not affordable   Post-COVID chronic cough  Language barrier affecting health care Assessment & Plan: I only speak english, but I tried to use the little spanish I know where I can.  We had excellent translator but its possible some of my education was not fully understood I put AVS info in english and spanish for her, she requested it in english to help her learn       Subjective:   Kathryn Salas presents today with intent to establish care with Kathryn Olszewski, MD as her Primary Care Provider (PCP) going forward.   Her main concern(s) / chief complaint(s) are New Patient (Initial Visit), Post COVID symptoms (Still feels congested and having difficulty with hearing. Can hear a noise in chest due to congestion sometimes.), and Ear clogged   HPI  Problem-oriented charting was used to develop and update her medical history: Problem  Etd (Eustachian Tube Dysfunction), Bilateral   Congestion without pain bilateral ears. Bulging both eardrums on exam.   Language Barrier Affecting Health Care   She only speaks spanish   Post-Covid Chronic Cough  History of Asthma   Also sometimes coughs when she goes for walk but no significant wheezing Has advair but afraid to take it Albuterol in past caused tachycardia Doesn't feel its flared long term Cleans houses with bleach, lots of plants in home, has cats.   Allergic Rhinitis  History of Colonic Polyps   Pt had a polyp found on colonoscopy 07/10/12 with recomendation to repeat in 5 year - [facility] hospital Follow up not available / overdue   Moderate Persistent Asthma With Exacerbation (Resolved)     Depression Screen    07/03/2022    8:27 AM  PHQ 2/9 Scores  PHQ - 2 Score 0   No results found for any visits on 07/03/22.  The following were reviewed and entered/updated into her MEDICAL RECORD NUMBERPast Medical History:  Diagnosis Date   Anxiety    Arthritis    Asthma    Fatty liver    History of kidney stones    Migraines     Pneumonia    Screening breast examination 02/16/2019   History reviewed. No pertinent surgical history. Family History  Problem Relation Age of Onset   Hypertension Mother    Depression Mother    Cancer Father        Stomach Cancer   Cancer Maternal Aunt        Breast   Breast cancer Maternal Aunt    Cancer Paternal Grandmother        ovarian cancer   Outpatient Medications Prior to Visit  Medication Sig Dispense Refill   Cholecalciferol 125 MCG (5000 UT) TABS Take by mouth.     fluticasone-salmeterol (ADVAIR) 250-50 MCG/ACT AEPB SMARTSIG:By Mouth     ibuprofen (ADVIL) 800 MG tablet Take by mouth.     MELOXICAM PO Take by mouth as needed.     montelukast (SINGULAIR) 10 MG tablet Take 10  mg by mouth daily.     omeprazole (PRILOSEC) 40 MG capsule Take 40 mg by mouth daily.     sulfamethoxazole-trimethoprim (BACTRIM DS) 800-160 MG tablet Take 1 tablet by mouth daily.     traZODone (DESYREL) 50 MG tablet Take 50 mg by mouth at bedtime.     vitamin E 1000 UNIT capsule Take by mouth daily. Not sure of dosage     benzonatate (TESSALON PERLES) 100 MG capsule 1 - 2 po Q 8 hour prn cough 20 capsule 3   benzonatate (TESSALON) 200 MG capsule Take 1 capsule (200 mg total) by mouth 2 (two) times daily as needed for cough. 20 capsule 0   brompheniramine-pseudoephedrine-DM 30-2-10 MG/5ML syrup Take 5 mLs by mouth 4 (four) times daily as needed. 140 mL 0   dextromethorphan-guaiFENesin (ROBITUSSIN-DM) 10-100 MG/5ML liquid Take 20 mLs by mouth every 6 (six) hours as needed for cough. 150 mL 0   doxycycline (VIBRAMYCIN) 100 MG capsule Take 1 capsule (100 mg total) by mouth 2 (two) times daily. One po bid x 7 days 14 capsule 0   famotidine (PEPCID) 20 MG tablet Take 20 mg by mouth 2 (two) times daily.     gabapentin (NEURONTIN) 100 MG capsule 1 po bid prn pain.  Tome una tableta por boca dos veces diarias cuando sea necesario para Chief Technology Officer. 60 capsule 3   magic mouthwash w/lidocaine SOLN 5cc ( 1  teaspoon)  swish and swallow q 6 hour prn.  5 mL (1 cucharadita) enjuague y beba cada 6 horas cuando sea necesario. (Patient not taking: Reported on 10/18/2017) 120 mL 1   naproxen (NAPROSYN) 250 MG tablet Take 1 tablet by mouth daily as needed.      ondansetron (ZOFRAN) 4 MG tablet Take 1 tablet (4 mg total) by mouth every 8 (eight) hours as needed for nausea or vomiting. 20 tablet 0   oseltamivir (TAMIFLU) 75 MG capsule Take 1 capsule (75 mg total) by mouth every 12 (twelve) hours. 10 capsule 0   prednisoLONE acetate (PRED FORTE) 1 % ophthalmic suspension Place into the right eye.     ranitidine (ZANTAC) 150 MG tablet Take 150 mg by mouth daily as needed for heartburn.     traZODone (DESYREL) 100 MG tablet Take 50 mg by mouth at bedtime.     No facility-administered medications prior to visit.    Allergies  Allergen Reactions   Codeine Shortness Of Breath   Morphine And Related Shortness Of Breath   Latex    Pamelor [Nortriptyline Hcl]    Social History   Tobacco Use   Smoking status: Never   Smokeless tobacco: Never  Vaping Use   Vaping Use: Never used  Substance Use Topics   Alcohol use: No   Drug use: No    Immunization History  Administered Date(s) Administered   PFIZER(Purple Top)SARS-COV-2 Vaccination 01/13/2020    Objective:  Physical Exam  BP (!) 140/78 (BP Location: Left Arm, Patient Position: Sitting)   Pulse 76   Temp 97.7 F (36.5 C) (Temporal)   Ht  (1.626 m)   Wt 150 lb 6.4 oz (68.2 kg)   LMP 03/19/2003 (Approximate)   SpO2 98%   BMI 25.82 kg/m  Wt Readings from Last 10 Encounters:  07/03/22 150 lb 6.4 oz (68.2 kg)  07/06/21 146 lb 3.2 oz (66.3 kg)  01/17/21 145 lb (65.8 kg)  06/01/20 143 lb 1.6 oz (64.9 kg)  02/16/19 146 lb 8 oz (66.5 kg)  10/25/17 142  lb 13.7 oz (64.8 kg)  10/18/17 143 lb (64.9 kg)  10/15/17 143 lb 8 oz (65.1 kg)  10/08/17 144 lb 12 oz (65.7 kg)  09/23/17 145 lb 4 oz (65.9 kg)   Vital signs reviewed.  Nursing notes  reviewed. Weight trend reviewed. Abnormalities and problem-specific physical exam findings:  inflamed sinuses, bulging tympanic membrane bilateral clear to auscultation bilaterally,s1 and s2 heart sounds have regular rate and rhythm  General Appearance:  Well developed, well nourished, well-groomed, healthy-appearing female with Body mass index is 25.82 kg/m. No acute distress appreciable.   Skin: Clear and well-hydrated. Pulmonary:  Normal work of breathing at rest, no respiratory distress apparent. SpO2: 98 %  Musculoskeletal: She demonstrates smooth and coordinated movements throughout all major joints. All extremities are intact.  Neurological:  Awake, alert, oriented, and engaged.  No obvious focal neurological deficits or cognitive impairments.  Sensorium seems unclouded. Gait is smooth and coordinated Psychiatric:  Appropriate mood, pleasant and cooperative demeanor, cheerful and engaged during the exam  Results Reviewed (reviewed labs/imaging may be also be found in the assessment / plan section):    Results for orders placed or performed in visit on 07/06/21  Fecal occult blood, imunochemical   Specimen: STOOL  Result Value Ref Range   Fecal Occult Bld CANCELED

## 2022-07-04 NOTE — Assessment & Plan Note (Signed)
This is my primary diagnosis for today that in my medical opinion best explains the patient's reason for visit of chronic cough, post nasal drip,evolving to chest congestion I prefer this diagnosis to asthma due to lack of wheezing, and marked sinus inflammation and tympanic membrane bulging was observed I advised patient to treat this as severe allergic sinusitis primarily, and remove plants from her home, and stop using bleach for cleaning

## 2022-07-26 ENCOUNTER — Ambulatory Visit (HOSPITAL_COMMUNITY): Payer: Self-pay

## 2022-07-26 ENCOUNTER — Inpatient Hospital Stay: Payer: Self-pay

## 2022-08-25 ENCOUNTER — Encounter: Payer: Self-pay | Admitting: Internal Medicine

## 2022-08-30 ENCOUNTER — Ambulatory Visit (HOSPITAL_COMMUNITY)
Admission: RE | Admit: 2022-08-30 | Discharge: 2022-08-30 | Disposition: A | Discharge status: Home / Self Care | Payer: Self-pay | Source: Ambulatory Visit | Attending: Obstetrics and Gynecology | Admitting: Obstetrics and Gynecology

## 2022-08-30 ENCOUNTER — Inpatient Hospital Stay: Payer: Self-pay | Admitting: Hematology and Oncology

## 2022-08-30 VITALS — BP 128/70 | Wt 151.0 lb

## 2022-08-30 DIAGNOSIS — Z1231 Encounter for screening mammogram for malignant neoplasm of breast: Secondary | ICD-10-CM | POA: Insufficient documentation

## 2022-08-30 NOTE — Progress Notes (Signed)
Ms. Tommi Gilroy is a 57 y.o. female who presents to [facility] clinic today with no complaints.    Pap Smear: Pap not smear completed today. Last Pap smear was 09/13/2020 at [facility] clinic and was normal. Per patient has no history of an abnormal Pap smear. Last Pap smear result is available in Epic.   Physical exam: Breasts Breasts symmetrical. No skin abnormalities bilateral breasts. No nipple retraction bilateral breasts. No nipple discharge bilateral breasts. No lymphadenopathy. No lumps palpated bilateral breasts.       Pelvic/Bimanual Pap is not indicated today    Smoking History: Patient has never smoked. Not referred to quit line.    Patient Navigation: Patient education provided. Access to services provided for patient through [facility] program. Lissa Hoard at [facility] interpreter provided. No transportation provided   Colorectal Cancer Screening: Per patient has never had colonoscopy completed. Patient will be completing a FIT colorectal screen and mail. No complaints today.    Breast and Cervical Cancer Risk Assessment: Patient does not have family history of breast cancer, known genetic mutations, or radiation treatment to the chest before age 64. Patient does not have history of cervical dysplasia, immunocompromised, or DES exposure in-utero.  Risk Scores as of 08/30/2022     Dondra Spry           5-year 1.21 %   Lifetime 7.9 %   This patient is [place]/Lat[place] but has no documented birth country, so the [place] model used data from [place] patients to calculate their risk score. Document a birth country in the Demographics activity for a more accurate score.         Last calculated by Narda Rutherford, LPN on 1/61/0960 at  1:06 PM        A: [facility] exam without pap smear No complaints of breasts  P: Referred patient to the [facility] for a screening mammogram. Appointment scheduled 08/30/2022 at 1:00 pm..  Joette Catching, RN 08/30/2022 1:17 PM

## 2022-08-30 NOTE — Patient Instructions (Signed)
Taught Kathryn Salas about self breast awareness and gave educational materials to take home. Patient did not need a Pap smear today due to last Pap smear was in 09/13/20 per patient. Let her know [facility] will cover Pap smears every 5 years unless has a history of abnormal Pap smears. Referred patient to the [facility] for screening mammogram. Appointment scheduled for 09/02/21. Patient aware of appointment and will be there. Let patient know will follow up with her within the next couple weeks with results. Kathryn Salas verbalized understanding.  Pascal Lux, NP 1:23 PM

## 2022-09-10 LAB — COLOGUARD: COLOGUARD: NEGATIVE

## 2022-09-11 NOTE — Progress Notes (Signed)
Notify patient if not seen in myChart: Cologuard testing was Negative, this is great news.  My usual recommendation for future testing is: Cologuard testing once every 3 years from age 56 to 44 years unless you have a particularly concerning family history of colon cancer, but we can do it more frequently if desired.  Lula Olszewski, MD  09/11/2022 7:41 AM

## 2022-11-12 ENCOUNTER — Ambulatory Visit
Admission: RE | Admit: 2022-11-12 | Discharge: 2022-11-12 | Disposition: A | Discharge status: Home / Self Care | Payer: Self-pay | Source: Ambulatory Visit | Attending: Nurse Practitioner | Admitting: Nurse Practitioner

## 2022-11-12 VITALS — BP 115/74 | Temp 98.1°F | Resp 13

## 2022-11-12 DIAGNOSIS — R399 Unspecified symptoms and signs involving the genitourinary system: Secondary | ICD-10-CM | POA: Insufficient documentation

## 2022-11-12 LAB — POCT URINALYSIS DIP (MANUAL ENTRY)
Bilirubin, UA: NEGATIVE
Glucose, UA: NEGATIVE mg/dL
Ketones, POC UA: NEGATIVE mg/dL
Leukocytes, UA: NEGATIVE
Nitrite, UA: NEGATIVE
Protein Ur, POC: NEGATIVE mg/dL
Spec Grav, UA: >=1.03 — AB (ref 1.010–1.025)
Urobilinogen, UA: 0.2 E.U./dL
pH, UA: 5.5 (ref 5.0–8.0)

## 2022-11-12 MED ORDER — NITROFURANTOIN MONOHYD MACRO 100 MG PO CAPS
100.0000 mg | ORAL_CAPSULE | Freq: Two times a day (BID) | ORAL | 0 refills | Status: DC
Start: 2022-11-12 — End: 2022-11-22

## 2022-11-12 NOTE — ED Provider Notes (Signed)
[facility]    CSN: [id number] Arrival date & time: 11/12/22  1300      History   Chief Complaint Chief Complaint  Patient presents with   Dysuria    Possible UTI - Entered by patient    HPI Kathryn Salas is a 57 y.o. female.   The history is provided by the patient. A language interpreter was used Cicero Duck [id number]).   The patient presents for complaints of pain with urination, lower abdominal discomfort, pelvic pressure, and low back pain.  Patient states symptoms started over the past 5 days, she states she has since developed low back pain starting this morning.  Patient denies fever, chills, chest pain, nausea, vomiting, dysuria, hematuria, decreased urine stream, flank pain, or vaginal symptoms.  Patient reports a prior history of UTI and kidney stones approximately 20 years ago.  Patient has not taken any medication for symptoms.  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 No pertinent surgical history.  OB History     Gravida  3   Para      Term      Preterm      AB      Living         SAB      IAB      Ectopic      Multiple      Live Births  3            Home Medications    Prior to Admission medications   Medication Sig Start Date End Date Taking? Authorizing Provider   nitrofurantoin, macrocrystal-monohydrate, (MACROBID) 100 MG capsule Take 1 capsule (100 mg total) by mouth 2 (two) times daily. 11/12/22  Yes Alley Neils-Warren, Sadie Haber, NP  amoxicillin-clavulanate (AUGMENTIN) 875-125 MG tablet Take 1 tablet by mouth 2 (two) times daily. Solo tome si tiene pus fiebre o dolor.  Deje de tomar bactrim mientras. 07/03/22   Lula Olszewski, MD  Cholecalciferol 125 MCG (5000 UT) TABS Take by mouth.    [provider]  fluticasone (FLONASE) 50 MCG/ACT nasal spray Place 2 sprays into both nostrils daily. Use after simply saline sinus rinse at night 07/03/22   Lula Olszewski, MD  fluticasone-salmeterol (ADVAIR) 250-50 MCG/ACT AEPB SMARTSIG:By Mouth 06/13/22   [provider]  ibuprofen (ADVIL) 800 MG tablet Take by mouth. 12/28/19   [provider]  levalbuterol (XOPENEX HFA) 45 MCG/ACT inhaler Inhale 1 puff into the lungs every 4 (four) hours as needed for wheezing. Take as rescue inhaler for shortness of breath or wheezing. 07/03/22   Lula Olszewski, MD  loratadine (CLARITIN) 10 MG tablet Take 1 tablet (10 mg total) by mouth daily. 07/03/22   Lula Olszewski, MD  MELOXICAM PO Take by mouth as needed.  [provider]  montelukast (SINGULAIR) 10 MG tablet Take 10 mg by mouth daily. 06/13/22   [provider]  omeprazole (PRILOSEC) 40 MG capsule Take 40 mg by mouth daily.    [provider]  Saline (SIMPLY SALINE) 0.9 % AERS Place 2 each into the nose as directed. Use nightly for sinus hygiene long-term.  Can also be used as many times daily as desired to assist with clearing congested sinuses. 07/03/22   Lula Olszewski, MD  sulfamethoxazole-trimethoprim (BACTRIM DS) 800-160 MG tablet Take 1 tablet by mouth daily. 05/20/22   [provider]  traZODone (DESYREL) 50 MG tablet Take 50 mg by mouth at bedtime. 06/16/22   [provider]  vitamin E 1000 UNIT capsule Take by mouth daily. Not sure of dosage     [provider]    Family History Family History  Problem Relation Age of Onset   Hypertension Mother    Depression Mother    Cancer Father        Stomach Cancer   Cancer Maternal Aunt        Breast   Breast cancer Maternal Aunt    Cancer Paternal Grandmother        ovarian cancer    Social History Social History   Tobacco Use   Smoking status: Never   Smokeless tobacco: Never  Vaping Use   Vaping status: Never Used  Substance Use Topics   Alcohol use: No   Drug use: No     Allergies   Codeine, Morphine and codeine, Latex, and Pamelor [nortriptyline hcl]   Review of Systems Review of Systems Per HPI  Physical Exam Triage Vital Signs ED Triage Vitals  Encounter Vitals Group     BP 11/12/22 1322 115/74     Systolic BP Percentile --      Diastolic BP Percentile --      Pulse --      Resp 11/12/22 1322 13     Temp 11/12/22 1322 98.1 F (36.7 C)     Temp Source 11/12/22 1322 Oral     SpO2 11/12/22 1322 98 %     Weight --      Height --      Head Circumference --      Peak Flow --      Pain Score 11/12/22 1323 6     Pain Loc --      Pain Education --      Exclude from Growth Chart --    No data found.  Updated Vital Signs BP 115/74 (BP Location: Right Arm)   Temp 98.1 F (36.7 C) (Oral)   Resp 13   LMP 03/19/2003 (Approximate)   SpO2 98%   Visual Acuity Right Eye Distance:   Left Eye Distance:   Bilateral Distance:    Right Eye Near:   Left Eye Near:    Bilateral Near:     Physical Exam Vitals and nursing note reviewed.  Constitutional:      General: She is not in acute distress.    Appearance: Normal appearance.  HENT:     Head: Normocephalic.  Eyes:     Extraocular Movements: Extraocular movements intact.     Pupils: Pupils are equal, round, and reactive to light.  Cardiovascular:     Rate and Rhythm: Regular rhythm.     Pulses: Normal pulses.     Heart sounds: Normal heart sounds.  Pulmonary:     Effort: Pulmonary  effort is  normal.     Breath sounds: Normal breath sounds.  Abdominal:     General: Bowel sounds are normal.     Palpations: Abdomen is soft.     Tenderness: There is no abdominal tenderness. There is no right CVA tenderness or left CVA tenderness.  Musculoskeletal:     Cervical back: Normal range of motion.  Skin:    General: Skin is warm and dry.  Neurological:     General: No focal deficit present.     Mental Status: She is alert and oriented to person, place, and time.  Psychiatric:        Mood and Affect: Mood normal.        Behavior: Behavior normal.      UC Treatments / Results  Labs (all labs ordered are listed, but only abnormal results are displayed) Labs Reviewed  POCT URINALYSIS DIP (MANUAL ENTRY) - Abnormal; Notable for the following components:      Result Value   Clarity, UA hazy (*)    Spec Grav, UA >=1.030 (*)    Blood, UA trace-intact (*)    All other components within normal limits  URINE CULTURE    EKG   Radiology No results found.  Procedures Procedures (including critical care time)  Medications Ordered in UC Medications - No data to display  Initial Impression / Assessment and Plan / UC Course  I have reviewed the triage vital signs and the nursing notes.  Pertinent labs & imaging results that were available during my care of the patient were reviewed by me and considered in my medical decision making (see chart for details).  The patient is well-appearing, she is in no acute distress, vital signs are stable.  Urine culture does not show an obvious urinary tract infection; however, based on the patient's symptoms, will treat empirically with Macrobid 100 mg.  Urine culture is pending.  Supportive care recommendations were provided and discussed with the patient to include increasing fluids, developing a toileting schedule, and cutting out caffeine while symptoms persist.  Patient was advised she will be contacted if the results of the culture  are negative and she may need to stop the antibiotic, or if the result of the culture is positive and medication needs to be changed.  Patient was given strict ER follow-up precautions.  Patient is in agreement with this plan of care and verbalizes understanding.  All questions were answered.  Patient stable for discharge.  Final Clinical Impressions(s) / UC Diagnoses   Final diagnoses:  UTI symptoms     Discharge Instructions      -A urine culture is pending. You will be contacted if the results of the culture are negative and asked to stop the antibiotic. If the culture is positive and the medication needs to be changed, you will be contacted.  -Take medications as prescribed. -Increase fluids. -Ibuprofen or Tylenol for pain, fever, or general discomfort. -Develop a toileting schedule that will allow you to toilet at least every 2 hours. -Avoid caffeine to include tea, soda, and coffee. -If sexually active, void at least 15 to 20 minutes after sexual intercourse. -If your culture result is negative and you are continuing to experience symptoms, please follow-up with your primary care physician for further evaluation. -Follow-up in the emergency department if you develop fever, chills, worsening abdominal pain, or other concerns.  -Follow-up as needed.     ED Prescriptions     Medication Sig Dispense Auth. Provider   nitrofurantoin, macrocrystal-monohydrate, (MACROBID)  100 MG capsule Take 1 capsule (100 mg total) by mouth 2 (two) times daily. 10 capsule Zykeria Laguardia-Warren, Sadie Haber, NP      PDMP not reviewed this encounter.   Abran Cantor, NP 11/12/22 1404

## 2022-11-12 NOTE — Discharge Instructions (Signed)
-  A urine culture is pending. You will be contacted if the results of the culture are negative and asked to stop the antibiotic. If the culture is positive and the medication needs to be changed, you will be contacted.  -Take medications as prescribed. -Increase fluids. -Ibuprofen or Tylenol for pain, fever, or general discomfort. -Develop a toileting schedule that will allow you to toilet at least every 2 hours. -Avoid caffeine to include tea, soda, and coffee. -If sexually active, void at least 15 to 20 minutes after sexual intercourse. -If your culture result is negative and you are continuing to experience symptoms, please follow-up with your primary care physician for further evaluation. -Follow-up in the emergency department if you develop fever, chills, worsening abdominal pain, or other concerns.  -Follow-up as needed.

## 2022-11-12 NOTE — ED Triage Notes (Signed)
Pt c/o Uti sx's, dyuria, abdominal discomfort.,pain and pressure, lower back pain, no burning with urination.

## 2022-11-13 LAB — URINE CULTURE: Culture: NO GROWTH

## 2022-11-20 ENCOUNTER — Encounter: Payer: Self-pay | Admitting: Hematology and Oncology

## 2022-11-20 ENCOUNTER — Telehealth: Payer: Self-pay

## 2022-11-20 NOTE — Telephone Encounter (Addendum)
Patient's daughter, Cleotis Nipper returned call, stated patient is complaining of Lower abdominal pain, pressure, spasms, heaviness x 2 weeks. Patient went to the [facility] ED on 11/12/2022 for UTI symptoms, was told urinalysis and urine cx did show a UTI. [place] informed patient does need to be seen, but our clinic is not able to treat patient's symptoms. I contacted [facility], spoke with Gazelle, LPN, was told to have patient or patient's daughter to call to re-establish, provide documents needed. Patient's daughter informed to call asap.   Patient's daughter left message requesting return call. Left message on voicemail requesting return call.

## 2022-11-21 NOTE — Telephone Encounter (Signed)
Yes, we can refer her to [facility]. I will contact her daughter. I had spoken with her daughter and called [facility] to see if they could help her yesterday.

## 2022-11-21 NOTE — Telephone Encounter (Signed)
Patient is scheduled at [facility] 11/22/2022 @ 10:30 am. Patient's daughter, Cleotis Nipper is aware, and will be attending the appointment with patient.

## 2022-11-22 ENCOUNTER — Ambulatory Visit (INDEPENDENT_AMBULATORY_CARE_PROVIDER_SITE_OTHER): Payer: Self-pay | Admitting: Obstetrics & Gynecology

## 2022-11-22 ENCOUNTER — Encounter: Payer: Self-pay | Admitting: Obstetrics & Gynecology

## 2022-11-22 VITALS — BP 137/72 | HR 69 | Ht 64.0 in | Wt 150.2 lb

## 2022-11-22 DIAGNOSIS — R102 Pelvic and perineal pain: Secondary | ICD-10-CM

## 2022-11-22 DIAGNOSIS — Z780 Asymptomatic menopausal state: Secondary | ICD-10-CM

## 2022-11-22 NOTE — Progress Notes (Signed)
   GYN VISIT Patient name: Merleen Dice MRN [id number]  Date of birth: [birth year] Chief Complaint:   Pelvic Pain  History of Present Illness:   Olga Bapst is a 57 y.o. G3P0 PM female being seen today for the following concerns:     Pt prefers daughter as her interpreter  -Pelvic pain: Notes pain lower abdomen and radiates to her back. Pain started about 3wks ago, comes/goes, but has intense pain especially with activity like lifting.  No medication.  Pain is 7/10.  Feels cramping like something is going to come out.  Some nausea, no vomiting.  If anything BMs have improved to daily.  No bleeding, discharge, no itching or odor.  No urinary concerns- prior UA and culture recently completed- reviewed negative.  Denies dyspareunia, but while this has been going on, she has not had sex.  No other acute complaints.   Patient's last menstrual period was 03/19/2003 (approximate).   Review of Systems:   Pertinent items are noted in HPI Denies fever/chills, dizziness, headaches, visual disturbances, fatigue, shortness of breath, chest pain. Pertinent History Reviewed:  History reviewed. No pertinent surgical history.  Past Medical History:  Diagnosis Date   Anxiety    Arthritis    Asthma    Fatty liver    History of kidney stones    Migraines    Pneumonia    Screening breast examination 02/16/2019   Reviewed problem list, medications and allergies. Physical Assessment:   Vitals:   11/22/22 1023  BP: 137/72  Pulse: 69  Weight: 150 lb 3.2 oz (68.1 kg)  Height: 5\' 4"  (1.626 m)  Body mass index is 25.78 kg/m.       Physical Examination:   General appearance: alert, well appearing, and in no distress  Psych: mood appropriate, normal affect  Skin: warm & dry   Cardiovascular: normal heart rate noted  Respiratory: normal respiratory effort, no distress  Abdomen: soft, non-tender   Pelvic: VULVA: normal appearing vulva with no masses, tenderness or lesions, VAGINA: normal  appearing vagina with normal color and discharge, no lesions, CERVIX: normal appearing cervix without discharge or lesions, UTERUS: uterus is normal size, shape, consistency, diffuse tenderness noted throughout exam ADNEXA: no masses  Extremities: no edema   Chaperone: Faith Rogue    Assessment & Plan:  1) Pelvic pain -etiology unclear, no abnormalities noted on exam -ok to take OTC pain meds -plan for pelvic US to r/o underlying etiology, further management pending results   Orders Placed This Encounter  Procedures   US PELVIC COMPLETE WITH TRANSVAGINAL    Return for if possible pelvic US with Amber or AP.   Myna Hidalgo, DO Attending Obstetrician & Gynecologist, [facility] for [employer], [facility]

## 2023-01-13 ENCOUNTER — Other Ambulatory Visit: Payer: Self-pay | Admitting: Radiology

## 2023-01-13 ENCOUNTER — Encounter: Payer: Self-pay | Admitting: Obstetrics & Gynecology

## 2023-01-13 ENCOUNTER — Ambulatory Visit: Payer: Self-pay | Admitting: Obstetrics & Gynecology

## 2023-04-03 ENCOUNTER — Ambulatory Visit (INDEPENDENT_AMBULATORY_CARE_PROVIDER_SITE_OTHER): Payer: Self-pay | Admitting: Obstetrics & Gynecology

## 2023-04-03 ENCOUNTER — Encounter: Payer: Self-pay | Admitting: Obstetrics & Gynecology

## 2023-04-03 VITALS — BP 133/64 | HR 74 | Ht 64.0 in | Wt 150.2 lb

## 2023-04-03 DIAGNOSIS — N644 Mastodynia: Secondary | ICD-10-CM

## 2023-04-03 DIAGNOSIS — R102 Pelvic and perineal pain: Secondary | ICD-10-CM

## 2023-04-03 NOTE — Progress Notes (Signed)
GYN VISIT Patient name: Linzi Darbonne MRN [id number]  Date of birth: Aug 24, 1965 Chief Complaint:   Pelvic Pain  History of Present Illness:   Safiyyah Buehrle is a 58 y.o. G3P0 PM female being seen today for the following concerns:  Spanish interpreter present  Pelvic pain: Pt seen 11/2022 for similar issue noted intense pain for several weeks, rated her pain 7/10.  Worse with activity like lifting.  Plan was for pelvic US- which was not completed due to scheduling issues and she was not aware that she needed to call to reschedule, she thought someone would call her.  Pain is lower abdomen/pelvic and radiates around to her back.  She reports pain in her back that is "sciatic" In the past taken NSAIDs and muscle-relaxers, which she still has only takes when she has severe pain.  She feels like the pain is constant and crampy in nature.  Feels like she is having her period. Taking tylenol, but only helps a little bit.    This week it seems to better, but at time it may be up to a 10 and pain radiates to her back.  She does not want to take too much pain medication because she more importantly wants to know the cause.  Notes BMs daily- denies constipation, diarrhea, nausea, vomiting or any GI symptoms.  Denies pain with intercourse.  Denies any urinary concerns.  After her last exam, she had one day of pink spotting and then nothing further.  No vaginal bleeding, discharge, itching or irritation.  Breast pain: She also notes considerable breast pain.  Both sides, not sure what makes it worse or betters.  States the pain is similar to after she breastfed her children.  No nipple discharge.  No breast trauma.  Denies hot flashes or night sweats.  Reports going through menopause in late 30s- did not require HRT and menopausal symptoms.  Patient's last menstrual period was 03/19/2003 (approximate).    Review of Systems:   Pertinent items are noted in HPI Denies fever/chills, dizziness,  headaches, visual disturbances, fatigue, shortness of breath, chest pain. Pertinent History Reviewed:  No past surgical history on file.  Past Medical History:  Diagnosis Date   Anxiety    Arthritis    Asthma    Fatty liver    History of kidney stones    Migraines    Pneumonia    Screening breast examination 02/16/2019   Reviewed problem list, medications and allergies. Physical Assessment:   Vitals:   04/03/23 1440 04/03/23 1510  BP: (!) 151/78 133/64  Pulse: 83 74  Weight: 150 lb 3.2 oz (68.1 kg)   Height: 5\' 4"  (1.626 m)   Body mass index is 25.78 kg/m.       Physical Examination:   General appearance: alert, well appearing, and in no distress  Psych: mood appropriate, normal affect  Skin: warm & dry   Breast: no masses or abnormalities noted bilaterally  Cardiovascular: normal heart rate noted  Respiratory: normal respiratory effort, no distress  Abdomen: soft, no reproducible pain, no rebound, no guarding  Pelvic: VULVA: normal appearing vulva with no masses, tenderness or lesions, VAGINA: normal appearing vagina with normal color and discharge, no lesions, CERVIX: normal appearing cervix without discharge or lesions, UTERUS: uterus is normal size, shape, consistency and nontender, ADNEXA: normal adnexa in size, nontender and no masses  Extremities: no edema   Chaperone:  Sonia- spanish interpreter     Assessment & Plan:  1) Pelvic pain -etiology  of pain unclear -plan for pelvic US at next available -per pt seen by PCP- prior lab work (CBC, CMP, cholesterol) all normal -concern that pain may be musculoskeletal in nature -ok to take NSAIDs and muscle relaxers as needed  2) Breast pain -mammogram normal 08/2022 -no abnormalities on exam -pt drinks about 2 cups/coffee per day for many years- considering cutting back and adding vitamin E   No orders of the defined types were placed in this encounter.   Return for pelvic US/follow up with Artur Winningham.   Myna Hidalgo, DO Attending Obstetrician & Gynecologist, [facility] for [employer], [facility]

## 2023-04-14 ENCOUNTER — Encounter: Payer: Self-pay | Admitting: Obstetrics & Gynecology

## 2023-04-14 ENCOUNTER — Ambulatory Visit (INDEPENDENT_AMBULATORY_CARE_PROVIDER_SITE_OTHER): Payer: Self-pay

## 2023-04-14 ENCOUNTER — Ambulatory Visit (INDEPENDENT_AMBULATORY_CARE_PROVIDER_SITE_OTHER): Payer: Self-pay | Admitting: Obstetrics & Gynecology

## 2023-04-14 VITALS — BP 122/71 | HR 69 | Ht 64.0 in | Wt 151.4 lb

## 2023-04-14 DIAGNOSIS — R102 Pelvic and perineal pain: Secondary | ICD-10-CM

## 2023-04-14 NOTE — Progress Notes (Signed)
   GYN VISIT Patient name: Kathryn Salas MRN [id number]  Date of birth: Aug 23, 1965 Chief Complaint:   Follow-up ([place])  History of Present Illness:   Kathryn Salas is a 58 y.o. G3P0 PM female being seen today for follow up regarding:  Pelvic pain: In review patient reports intermittent intense pelvic pain for the past several weeks.  Pain worse with activity, describes the pain as crampy in nature feels similar to a menses.  Rates pain 7/10.  Starts lower abdomen/pelvic area and radiates around to her back.   Today she notes that it seems to be doing a little bit better.  The only change that she has made is that she is currently not going to the gym.  When she was going to the gym she mostly either walks or does the bicycle.  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  Patient's last menstrual period was 03/19/2003 (approximate).  [place] today: heterogeneous anteverted uterus,with multiple small fibroids,(#1)intramural fibroid anterior lower uterine segment 5 x 3 x 7 mm,(#2) fundal intramural 5 x 3 x 7 mm,small amount of fluid within the endometrium,avascular echogenic endometrial mass 8 x 7 x 9 mm,EEC 2.5 mm,normal ovaries,ovaries appear mobile,no free fluid,left adnexal discomfort during ultrasound   Review of Systems:   Pertinent items are noted in HPI Denies fever/chills, dizziness, headaches, visual disturbances, fatigue, shortness of breath, chest pain, abdominal pain, vomiting.  Denies constipation or diarrhea.  Denies blood in her stool.  Denies urinary concerns Pertinent History Reviewed:  History reviewed. No pertinent surgical history.  Past Medical History:  Diagnosis Date   Anxiety    Arthritis    Asthma    Fatty liver    History of kidney stones    Migraines    Pneumonia    Screening breast examination 02/16/2019   Reviewed problem list, medications and allergies. Physical Assessment:   Vitals:   04/14/23 0959  BP: 122/71  Pulse: 69  Weight: 151 lb 6.4 oz  (68.7 kg)  Height: 5\' 4"  (1.626 m)  Body mass index is 25.99 kg/m.       Physical Examination:   General appearance: alert, well appearing, and in no distress  Psych: mood appropriate, normal affect  Skin: warm & dry   Cardiovascular: normal heart rate noted  Respiratory: normal respiratory effort, no distress  Chaperone: N/A     Assessment & Plan:  1) pelvic pain -Reviewed ultrasound findings, both ovaries appear normal -Etiology of pain unclear though suspect musculoskeletal in nature -Reviewed OTC medication options if needed -Also encourage patient to listen to her body-if she does go to the gym consider changing her routine -Should pain worsen or continue would advise follow-up with PCP   No orders of the defined types were placed in this encounter.   Return for June 2026.   Myna Hidalgo, DO Attending Obstetrician & Gynecologist, [facility] for [employer], [facility]

## 2023-04-14 NOTE — Progress Notes (Signed)
PELVIC US TA/TV: heterogeneous anteverted uterus,with multiple small fibroids,(#1)intramural fibroid anterior lower uterine segment 5 x 3 x 7 mm,(#2) fundal intramural 5 x 3 x 7 mm,small amount of fluid within the endometrium,avascular echogenic endometrial mass 8 x 7 x 9 mm,EEC 2.5 mm,normal ovaries,ovaries appear mobile,no free fluid,left adnexal discomfort during ultrasound  Chaperone Naomi (interpreter)

## 2023-04-28 ENCOUNTER — Telehealth: Payer: Self-pay | Admitting: Internal Medicine

## 2023-04-28 DIAGNOSIS — Z538 Procedure and treatment not carried out for other reasons: Secondary | ICD-10-CM

## 2023-04-29 NOTE — Progress Notes (Signed)
I was over an hour behind scheduled appointment time  And nurse discussed with patient and she agreed to reschedule for later date.  Patient should not be charged, no service rendered

## 2023-05-26 ENCOUNTER — Telehealth: Payer: Self-pay

## 2023-05-26 NOTE — Telephone Encounter (Signed)
 Conducted a wellness follow up call by way of patient's husband/Luis Piriz by phone on today with interpretation assistance with Maricarmen status post an earlier appointment for their renewal enrollment at the [facility] office with [facility]/  Pt was unavailable at the time but her husband was able to assist with the update of his wife's health per her recent and previous conversation  together.   Her husband states the pt Pundt) has been complaining about experiencing some allergy like syptoms that she has concerns with;n  Plan  -Pt husband was advised to have her make an appointment  by contacting the health department which is her PCP and to schedule an appointment and my assistance can be utilized with arranging the call for her  Her spouse was advised to have the pt toexpect a call back from me by 1:30pm on today, to assist with the call

## 2023-05-26 NOTE — Telephone Encounter (Signed)
 Conducted a follow up call with patient to provide assistance with helping her get scheduled for a acute condition visit, due to a recent cough she acquired about 1 day being that the symptoms are the same as her grandson.    Plan -Assisted pt by phone to with getting his appointment scheduled with the [facility].     -Appointment has been scheduled for Thursday, March 13 at 2:00pm  -Pt was advised if any other needs arise as it relates to medical care questions or socio-determinant needs, to please give me or the  [facility] team a call  Pt was grateful for services received and call ended

## 2023-06-01 ENCOUNTER — Encounter: Payer: Self-pay | Admitting: Emergency Medicine

## 2023-06-01 ENCOUNTER — Ambulatory Visit
Admission: EM | Admit: 2023-06-01 | Discharge: 2023-06-01 | Disposition: A | Discharge status: Home / Self Care | Payer: Self-pay

## 2023-06-01 ENCOUNTER — Ambulatory Visit: Payer: Self-pay

## 2023-06-01 DIAGNOSIS — J22 Unspecified acute lower respiratory infection: Secondary | ICD-10-CM

## 2023-06-01 DIAGNOSIS — R059 Cough, unspecified: Secondary | ICD-10-CM

## 2023-06-01 DIAGNOSIS — Z8709 Personal history of other diseases of the respiratory system: Secondary | ICD-10-CM

## 2023-06-01 DIAGNOSIS — Z8701 Personal history of pneumonia (recurrent): Secondary | ICD-10-CM

## 2023-06-01 MED ORDER — PROMETHAZINE-DM 6.25-15 MG/5ML PO SYRP: 5.0000 mL | ORAL_SOLUTION | Freq: Four times a day (QID) | ORAL | 0 refills | Status: AC | PRN

## 2023-06-01 MED ORDER — PREDNISONE 20 MG PO TABS: 40.0000 mg | ORAL_TABLET | Freq: Every day | ORAL | 0 refills | Status: AC

## 2023-06-01 NOTE — ED Triage Notes (Signed)
 Cough and chest congestion since last Monday.  Was seen on Thursday by PCP for fatigue and SOB.   States not feeling better.  States she thinks she may have pneumonia.  Patient states she is currently taking Zithromax.

## 2023-06-01 NOTE — ED Provider Notes (Signed)
 [facility]    CSN: [id number] Arrival date & time: 06/01/23  0801      History   Chief Complaint Chief Complaint  Patient presents with   Cough    HPI Kathryn Salas is a 58 y.o. female.   The history is provided by the patient. Language interpreter usedWinn Jock; #[id number].   Patient presents for complaints of continued cough and chest congestion.  Patient states symptoms started almost 1 week ago.  She was seen by her PCP 4 days ago, and prescribed azithromycin, benzonatate, and an albuterol inhaler.  Patient states over the past several days, she states she feels like there is "foam in her chest."  She denies new fever, chills, wheezing, difficulty breathing, chest pain, abdominal pain, nausea, vomiting, diarrhea, or rash.  Patient with underlying history of asthma and pneumonia.  States that she was concerned because her doctor did not prescribe prednisone for her.  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 No pertinent surgical history.  OB History     Gravida  3   Para      Term      Preterm      AB      Living         SAB      IAB      Ectopic      Multiple      Live Births  3            Home Medications    Prior to Admission medications   Medication Sig Start Date End Date Taking?  Authorizing Provider  albuterol (VENTOLIN HFA) 108 (90 Base) MCG/ACT inhaler Inhale 2 puffs into the lungs every 6 (six) hours as needed for wheezing or shortness of breath.   Yes [provider]  azithromycin (ZITHROMAX) 250 MG tablet Take 250 mg by mouth daily.   Yes [provider]  benzonatate (TESSALON) 100 MG capsule Take by mouth 3 (three) times daily as needed for cough.   Yes [provider]  famotidine (PEPCID) 40 MG tablet Take 40 mg by mouth daily.   Yes [provider]  Cholecalciferol 125 MCG (5000 UT) TABS Take by mouth. Patient not taking: Reported on 11/22/2022    [provider]  fluticasone (FLONASE) 50 MCG/ACT nasal spray Place 2 sprays into both nostrils daily. Use after simply saline sinus rinse at night Patient not taking: Reported on 11/22/2022 07/03/22   Lula Olszewski, MD  fluticasone-salmeterol (ADVAIR) 250-50 MCG/ACT AEPB SMARTSIG:By Mouth Patient not taking: Reported on 04/14/2023 06/13/22   [provider]  ibuprofen (ADVIL) 800 MG tablet Take by mouth. Patient not taking: Reported on 04/14/2023 12/28/19   [provider]  levalbuterol Pauline Aus HFA) 45 MCG/ACT inhaler Inhale 1 puff into the  lungs every 4 (four) hours as needed for wheezing. Take as rescue inhaler for shortness of breath or wheezing. Patient not taking: Reported on 04/14/2023 07/03/22   Lula Olszewski, MD  loratadine (CLARITIN) 10 MG tablet Take 1 tablet (10 mg total) by mouth daily. Patient not taking: Reported on 11/22/2022 07/03/22   Lula Olszewski, MD  MELOXICAM PO Take by mouth as needed. Patient not taking: Reported on 04/14/2023    [provider]  montelukast (SINGULAIR) 10 MG tablet Take 10 mg by mouth daily. Patient not taking: Reported on 11/22/2022 06/13/22   [provider]  omeprazole (PRILOSEC) 40 MG capsule Take 40 mg by mouth daily. Patient not taking: Reported on 11/22/2022    [provider]  Saline  (SIMPLY SALINE) 0.9 % AERS Place 2 each into the nose as directed. Use nightly for sinus hygiene long-term.  Can also be used as many times daily as desired to assist with clearing congested sinuses. Patient not taking: Reported on 11/22/2022 07/03/22   Lula Olszewski, MD  traZODone (DESYREL) 50 MG tablet Take 50 mg by mouth at bedtime. 06/16/22   [provider]  vitamin E 1000 UNIT capsule Take by mouth daily. Not sure of dosage Patient not taking: Reported on 11/22/2022    [provider]    Family History Family History  Problem Relation Age of Onset   Hypertension Mother    Depression Mother    Cancer Father        Stomach Cancer   Cancer Maternal Aunt        Breast   Breast cancer Maternal Aunt    Cancer Paternal Grandmother        ovarian cancer    Social History Social History   Tobacco Use   Smoking status: Never   Smokeless tobacco: Never  Vaping Use   Vaping status: Never Used  Substance Use Topics   Alcohol use: No   Drug use: No     Allergies   Codeine, Morphine and codeine, Latex, and Pamelor [nortriptyline hcl]   Review of Systems Review of Systems Per HPI  Physical Exam Triage Vital Signs ED Triage Vitals  Encounter Vitals Group     BP 06/01/23 0812 108/70     Systolic BP Percentile --      Diastolic BP Percentile --      Pulse Rate 06/01/23 0812 (!) 104     Resp 06/01/23 0812 18     Temp 06/01/23 0812 98.2 F (36.8 C)     Temp Source 06/01/23 0812 Oral     SpO2 06/01/23 0812 96 %     Weight --      Height --      Head Circumference --      Peak Flow --      Pain Score 06/01/23 0817 0     Pain Loc --      Pain Education --      Exclude from Growth Chart --    No data found.  Updated Vital Signs BP 108/70 (BP Location: Right Arm)   Pulse (!) 104   Temp 98.2 F (36.8 C) (Oral)   Resp 18   LMP 03/19/2003 (Approximate)   SpO2 96%   Visual Acuity Right Eye Distance:   Left Eye Distance:   Bilateral Distance:     Right Eye Near:   Left Eye Near:    Bilateral Near:     Physical Exam Vitals and nursing note  reviewed.  Constitutional:      General: She is not in acute distress.    Appearance: Normal appearance.  HENT:     Head: Normocephalic.     Right Ear: Tympanic membrane, ear canal and external ear normal.     Left Ear: Tympanic membrane, ear canal and external ear normal.     Mouth/Throat:     Mouth: Mucous membranes are moist.     Pharynx: Posterior oropharyngeal erythema present.  Eyes:     Extraocular Movements: Extraocular movements intact.     Conjunctiva/sclera: Conjunctivae normal.     Pupils: Pupils are equal, round, and reactive to light.  Cardiovascular:     Rate and Rhythm: Regular rhythm. Tachycardia present.     Pulses: Normal pulses.     Heart sounds: Normal heart sounds.  Pulmonary:     Effort: Pulmonary effort is normal. No respiratory distress.     Breath sounds: Normal breath sounds. No stridor. No wheezing, rhonchi or rales.  Abdominal:     General: Bowel sounds are normal.     Palpations: Abdomen is soft.     Tenderness: There is no abdominal tenderness.  Musculoskeletal:     Cervical back: Normal range of motion.  Lymphadenopathy:     Cervical: No cervical adenopathy.  Skin:    General: Skin is warm and dry.  Neurological:     General: No focal deficit present.     Mental Status: She is alert and oriented to person, place, and time.  Psychiatric:        Mood and Affect: Mood normal.        Behavior: Behavior normal.      UC Treatments / Results  Labs (all labs ordered are listed, but only abnormal results are displayed) Labs Reviewed - No data to display  EKG   Radiology No results found.  Procedures Procedures (including critical care time)  Medications Ordered in UC Medications - No data to display  Initial Impression / Assessment and Plan / UC Course  I have reviewed the triage vital signs and the nursing notes.  Pertinent labs &  imaging results that were available during my care of the patient were reviewed by me and considered in my medical decision making (see chart for details).  On exam, lung sounds are clear throughout, room air sats at 96%.  Chest x-ray is pending.  If chest x-ray does show pneumonia, will start patient on Augmentin.  Patient with history of recurrent pneumonia and asthma.  In the interim, will start patient on prednisone 40 mg for the next 5 days, along with Promethazine DM for her cough.  Will have patient continue azithromycin at this time.  Supportive care recommendations were provided and discussed with the patient to include fluids, rest, over-the-counter analgesics, and use of a humidifier at nighttime during sleep.  Discussed indications with patient regarding follow-up.  Patient was in agreement with this plan of care and verbalized understanding.  All questions were answered.  Patient stable for discharge.  Final Clinical Impressions(s) / UC Diagnoses   Final diagnoses:  Cough, unspecified type   Discharge Instructions   None    ED Prescriptions   None    PDMP not reviewed this encounter.   Abran Cantor, NP 06/01/23 [phone]

## 2023-06-01 NOTE — Discharge Instructions (Addendum)
 Chest x-ray is pending.  You will be contacted if the results of the chest x-ray are abnormal.  If so, additional medication may be prescribed.  You also access to your results via MyChart. Take medication as prescribed. Continue azithromycin prescribed by your PCP. Increase fluids and allow for plenty of rest. Recommend use of a humidifier in your bedroom at nighttime during sleep and sleeping elevated on pillows while symptoms persist. Follow-up if you experience new symptoms of fever, wheezing, shortness of breath, or trouble breathing. Follow-up as needed.

## 2023-06-06 ENCOUNTER — Encounter: Payer: Self-pay | Admitting: Internal Medicine

## 2023-06-06 ENCOUNTER — Ambulatory Visit: Payer: Self-pay | Admitting: Internal Medicine

## 2023-06-06 VITALS — BP 123/70 | HR 75 | Temp 97.4°F | Ht 64.0 in | Wt 146.8 lb

## 2023-06-06 DIAGNOSIS — J4541 Moderate persistent asthma with (acute) exacerbation: Secondary | ICD-10-CM

## 2023-06-06 DIAGNOSIS — R935 Abnormal findings on diagnostic imaging of other abdominal regions, including retroperitoneum: Secondary | ICD-10-CM

## 2023-06-06 DIAGNOSIS — R1013 Epigastric pain: Secondary | ICD-10-CM

## 2023-06-06 DIAGNOSIS — J680 Bronchitis and pneumonitis due to chemicals, gases, fumes and vapors: Secondary | ICD-10-CM

## 2023-06-06 DIAGNOSIS — E860 Dehydration: Secondary | ICD-10-CM

## 2023-06-06 DIAGNOSIS — Z119 Encounter for screening for infectious and parasitic diseases, unspecified: Secondary | ICD-10-CM

## 2023-06-06 DIAGNOSIS — N200 Calculus of kidney: Secondary | ICD-10-CM

## 2023-06-06 MED ORDER — METHYLPREDNISOLONE ACETATE 80 MG/ML IJ SUSP
80.0000 mg | Freq: Once | INTRAMUSCULAR | Status: AC
Start: 2023-06-06 — End: 2023-06-06
  Administered 2023-06-06: 80 mg via INTRAMUSCULAR

## 2023-06-06 MED ORDER — NEBULIZER/TUBING/MOUTHPIECE KIT: 1.0000 | PACK | 1 refills | Status: DC | PRN

## 2023-06-06 MED ORDER — IPRATROPIUM-ALBUTEROL 0.5-2.5 (3) MG/3ML IN SOLN: 3.0000 mL | RESPIRATORY_TRACT | 1 refills | Status: AC | PRN

## 2023-06-06 NOTE — Progress Notes (Signed)
 ==============================  [facility]: [phone]   -- Medical Office Visit --  Patient: Kathryn Salas      Age: 58 y.o.       Sex:  female  Date:   06/06/2023 Today's Healthcare Provider: Lula Olszewski, MD  ==============================   Chief Complaint: COUGH/SHORTNESS OF BREATH/CHEST PRESSURE Unchanged from emergency room visit for same 4 days ago.  History of Present Illness Patient is a 58 year old female who presents with persistent cough and shortness of breath unchanged since emergency room visit 4 days ago. The cough is sometimes severe enough to cause shortness of breath and interfere with speaking. Patient reports chest tightness with episodes of coughing. Cough is described as wheezy and nonproductive. She reports difficulty sleeping when the cough is severe and feels that her "body gets tired" from the ongoing symptoms.  She has a history of recurrent pneumonia (four previous episodes) with a stable scar visible on chest x-ray. She has no sinus congestion but uses nasal drops as previously prescribed.   Patient reports using prednisone (recently completed course yesterday) without improvement. She also tried Bromfed DM without benefit. She notes that prednisone causes facial flushing with burning sensation.  Of note, she has been using bleach extensively for cleaning both at home and at work. Upon questioning, she admits to mixing bleach with Lysol, which caused burning of her eyes. She has previously been advised to avoid bleach as it may have contributed to similar symptoms in the past. She also recently handled baby chickens and wonders if this exposure could be contributing to her symptoms. Her grandchildren recently had a viral illness with cough and ear symptoms, raising concern for possible viral etiology.  Patient also describes a history of significant epigastric pain, which she initially thought was stomach-related. The  pain, described as "cutting from bottom to top" in the epigastric region, has since resolved. She previously saw a gynecologist for ovarian pain but all exams were normal. Recent abdominal x-ray showed calcification in the right upper quadrant consistent with nephrolithiasis, though she currently denies pain.  Patient denies smoking or alcohol use. She acknowledges poor fluid intake, reporting she rarely feels thirsty and only urinates approximately three times daily. Physical exam reveals dry mucous membranes consistent with dehydration.  Chest x-ray from ED visit shows no acute findings with stable scarring at the left lung base. No evidence of active pneumonia.   Background: This is a 58 y.o. female who has Gastroesophageal reflux disease; Chronic back pain; History of colonic polyps; Recurrent pneumonia; History of asthma; Post-nasal drip; Chronic maxillary sinusitis; Allergic rhinitis; ETD (Eustachian tube dysfunction), bilateral; Language barrier affecting health care; and Post-COVID chronic cough on their problem list.  Visually reviewed that patient  has a past medical history of Anxiety, Arthritis, Asthma, Fatty liver, History of kidney stones, Migraines, Pneumonia, and Screening breast examination (02/16/2019). Manually updated: No problems updated. Verbally reviewed (per Abridge-generated extraction):  Medications: Visually reviewed/updated: Current Outpatient Medications on File Prior to Visit  Medication Sig   albuterol (VENTOLIN HFA) 108 (90 Base) MCG/ACT inhaler Inhale 2 puffs into the lungs every 6 (six) hours as needed for wheezing or shortness of breath.   fluticasone (FLONASE) 50 MCG/ACT nasal spray Place 2 sprays into both nostrils daily. Use after simply saline sinus rinse at night   ibuprofen (ADVIL) 800 MG tablet Take by mouth as needed for headache.   loratadine (CLARITIN) 10 MG tablet Take 1 tablet (10 mg total) by mouth daily.  promethazine-dextromethorphan  (PROMETHAZINE-DM) 6.25-15 MG/5ML syrup Take 5 mLs by mouth 4 (four) times daily as needed.   azithromycin (ZITHROMAX) 250 MG tablet Take 250 mg by mouth daily. (Patient not taking: Reported on 06/06/2023)   benzonatate (TESSALON) 100 MG capsule Take by mouth 3 (three) times daily as needed for cough. (Patient not taking: Reported on 06/06/2023)   Cholecalciferol 125 MCG (5000 UT) TABS Take by mouth. (Patient not taking: Reported on 06/06/2023)   famotidine (PEPCID) 40 MG tablet Take 40 mg by mouth daily. (Patient not taking: Reported on 06/06/2023)   fluticasone-salmeterol (ADVAIR) 250-50 MCG/ACT AEPB SMARTSIG:By Mouth (Patient not taking: Reported on 06/06/2023)   levalbuterol (XOPENEX HFA) 45 MCG/ACT inhaler Inhale 1 puff into the lungs every 4 (four) hours as needed for wheezing. Take as rescue inhaler for shortness of breath or wheezing. (Patient not taking: Reported on 06/06/2023)   MELOXICAM PO Take by mouth as needed. (Patient not taking: Reported on 06/06/2023)   montelukast (SINGULAIR) 10 MG tablet Take 10 mg by mouth daily. (Patient not taking: Reported on 06/06/2023)   omeprazole (PRILOSEC) 40 MG capsule Take 40 mg by mouth daily. (Patient not taking: Reported on 06/06/2023)   Saline (SIMPLY SALINE) 0.9 % AERS Place 2 each into the nose as directed. Use nightly for sinus hygiene long-term.  Can also be used as many times daily as desired to assist with clearing congested sinuses. (Patient not taking: Reported on 06/06/2023)   traZODone (DESYREL) 50 MG tablet Take 50 mg by mouth at bedtime. (Patient not taking: Reported on 06/06/2023)   vitamin E 1000 UNIT capsule Take by mouth daily. Not sure of dosage (Patient not taking: Reported on 06/06/2023)   No current facility-administered medications on file prior to visit.  There are no discontinued medications.     Physical Exam:    06/06/2023    4:00 PM 06/01/2023    8:12 AM 04/14/2023    9:59 AM  Vitals with BMI  Height 5\' 4"   5\' 4"   Weight 146 lbs  13 oz  151 lbs 6 oz  BMI 25.19  25.98  Systolic 123 108 578  Diastolic 70 70 71  Pulse 75 104 69   Wt Readings from Last 10 Encounters:  06/06/23 146 lb 12.8 oz (66.6 kg)  04/14/23 151 lb 6.4 oz (68.7 kg)  04/03/23 150 lb 3.2 oz (68.1 kg)  11/22/22 150 lb 3.2 oz (68.1 kg)  08/30/22 151 lb (68.5 kg)  07/03/22 150 lb 6.4 oz (68.2 kg)  07/06/21 146 lb 3.2 oz (66.3 kg)  01/17/21 145 lb (65.8 kg)  06/01/20 143 lb 1.6 oz (64.9 kg)  02/16/19 146 lb 8 oz (66.5 kg)  Vital signs reviewed.  Nursing notes reviewed. Weight trend reviewed. Physical Exam  Physical Exam General Appearance:  No acute distress appreciable.   Well-groomed, healthy-appearing female.  Well proportioned with no abnormal fat distribution.  Good muscle tone. Pulmonary:  Normal work of breathing at rest, no respiratory distress apparent. SpO2: 97 %  Musculoskeletal: All extremities are intact.  Neurological:  Awake, alert, oriented, and engaged.  No obvious focal neurological deficits or cognitive impairments.  Sensorium seems unclouded.   Speech is clear and coherent with logical content. Psychiatric:  Appropriate mood, pleasant and cooperative demeanor, thoughtful and engaged during the exam Lungs sound clear to auscultation and normal heart rhythm and rate but she is frequently doing a lot long coughing spells with wheezy cough nonproductive    Results for orders placed or performed in  visit on 06/06/23  Amylase  Result Value Ref Range   Amylase 32 21 - 101 U/L  CBC with Differential/Platelet  Result Value Ref Range   WBC 9.3 3.8 - 10.8 Thousand/uL   RBC 5.04 3.80 - 5.10 Million/uL   Hemoglobin 14.2 11.7 - 15.5 g/dL   HCT 16.1 09.6 - 04.5 %   MCV 85.3 80.0 - 100.0 fL   MCH 28.2 27.0 - 33.0 pg   MCHC 33.0 32.0 - 36.0 g/dL   RDW 40.9 81.1 - 91.4 %   Platelets 231 140 - 400 Thousand/uL   MPV 10.8 7.5 - 12.5 fL   Neutro Abs 4,455 1,500 - 7,800 cells/uL   Absolute Lymphocytes 4,204 (H) 850 - 3,900 cells/uL    Absolute Monocytes 586 200 - 950 cells/uL   Eosinophils Absolute 37 15 - 500 cells/uL   Basophils Absolute 19 0 - 200 cells/uL   Neutrophils Relative % 47.9 %   Total Lymphocyte 45.2 %   Monocytes Relative 6.3 %   Eosinophils Relative 0.4 %   Basophils Relative 0.2 %  Comp Met (CMET)  Result Value Ref Range   Glucose, Bld 86 65 - 99 mg/dL   BUN 26 (H) 7 - 25 mg/dL   Creat 7.82 9.56 - 2.13 mg/dL   eGFR 73 > OR = 60 YQ/MVH/8.46N6   BUN/Creatinine Ratio 28 (H) 6 - 22 (calc)   Sodium 141 135 - 146 mmol/L   Potassium 3.7 3.5 - 5.3 mmol/L   Chloride 103 98 - 110 mmol/L   CO2 27 20 - 32 mmol/L   Calcium 9.4 8.6 - 10.4 mg/dL   Total Protein 7.7 6.1 - 8.1 g/dL   Albumin 4.4 3.6 - 5.1 g/dL   Globulin 3.3 1.9 - 3.7 g/dL (calc)   AG Ratio 1.3 1.0 - 2.5 (calc)   Total Bilirubin 0.3 0.2 - 1.2 mg/dL   Alkaline phosphatase (APISO) 60 37 - 153 U/L   AST 16 10 - 35 U/L   ALT 14 6 - 29 U/L  Hepatitis C Antibody  Result Value Ref Range   Hepatitis C Ab NON-REACTIVE NON-REACTIVE  HIV antibody (with reflex)  Result Value Ref Range   HIV 1&2 Ab, 4th Generation NON-REACTIVE NON-REACTIVE  Lipase  Result Value Ref Range   Lipase 40 7 - 60 U/L   Office Visit on 06/06/2023  Component Date Value   Amylase 06/06/2023 32    WBC 06/06/2023 9.3    RBC 06/06/2023 5.04    Hemoglobin 06/06/2023 14.2    HCT 06/06/2023 43.0    MCV 06/06/2023 85.3    MCH 06/06/2023 28.2    MCHC 06/06/2023 33.0    RDW 06/06/2023 12.4    Platelets 06/06/2023 231    MPV 06/06/2023 10.8    Neutro Abs 06/06/2023 4,455    Absolute Lymphocytes 06/06/2023 4,204 (H)    Absolute Monocytes 06/06/2023 586    Eosinophils Absolute 06/06/2023 37    Basophils Absolute 06/06/2023 19    Neutrophils Relative % 06/06/2023 47.9    Total Lymphocyte 06/06/2023 45.2    Monocytes Relative 06/06/2023 6.3    Eosinophils Relative 06/06/2023 0.4    Basophils Relative 06/06/2023 0.2    Glucose, Bld 06/06/2023 86    BUN 06/06/2023 26 (H)     Creat 06/06/2023 0.92    eGFR 06/06/2023 73    BUN/Creatinine Ratio 06/06/2023 28 (H)    Sodium 06/06/2023 141    Potassium 06/06/2023 3.7    Chloride 06/06/2023 103  CO2 06/06/2023 27    Calcium 06/06/2023 9.4    Total Protein 06/06/2023 7.7    Albumin 06/06/2023 4.4    Globulin 06/06/2023 3.3    AG Ratio 06/06/2023 1.3    Total Bilirubin 06/06/2023 0.3    Alkaline phosphatase (AP* 06/06/2023 60    AST 06/06/2023 16    ALT 06/06/2023 14    Hepatitis C Ab 06/06/2023 NON-REACTIVE    HIV 1&2 Ab, 4th Generati* 06/06/2023 NON-REACTIVE    Lipase 06/06/2023 40   Admission on 11/12/2022, Discharged on 11/12/2022  Component Date Value   Color, UA 11/12/2022 yellow    Clarity, UA 11/12/2022 hazy (A)    Glucose, UA 11/12/2022 negative    Bilirubin, UA 11/12/2022 negative    Ketones, POC UA 11/12/2022 negative    Spec Grav, UA 11/12/2022 >=1.030 (A)    Blood, UA 11/12/2022 trace-intact (A)    pH, UA 11/12/2022 5.5    Protein Ur, POC 11/12/2022 negative    Urobilinogen, UA 11/12/2022 0.2    Nitrite, UA 11/12/2022 Negative    Leukocytes, UA 11/12/2022 Negative    Specimen Description 11/12/2022                     Value:URINE, CLEAN CATCH Performed at [facility], [address], [place], [place] [address]    Special Requests 11/12/2022                     Value:NONE Performed at [facility], [address], [place], [place] [address]    Culture 11/12/2022                     Value:NO GROWTH Performed at [facility], [address], [place], [place] [address]    Report Status 11/12/2022 11/13/2022 FINAL   Office Visit on 07/03/2022  Component Date Value   COLOGUARD 09/05/2022 Negative    Results RADIOLOGY Chest X-ray: Stable scar on right lung, no acute findings (06/01/2023) Abdominal X-ray: Calcification in right upper quadrant, likely nephrolithiasis (06/01/2023)  Result Date: 06/01/2023 CLINICAL DATA:  Cough for 6 days with history of pneumonia EXAM: CHEST - 2  VIEW COMPARISON:  06/02/2022 FINDINGS: Linear scar at the left base. There is no edema, consolidation, effusion, or pneumothorax. Normal heart size and mediastinal contours. 4 mm calcification over the right upper quadrant, favored renal based on the lateral view. IMPRESSION: No acute finding.  Stable scarring at the left lung base. Electronically Signed      Assessment and Plan: Assessment & Plan Pneumonitis due to vapors [facility]) Assessment: Patient presents with persistent cough, chest tightness, and shortness of breath unresponsive to prednisone therapy. Clinical presentation strongly suggests chemical pneumonitis from bleach vapor inhalation. Patient confirms regular use of bleach for cleaning and admits to mixing bleach with other cleaning products. She has a history of similar symptoms in the past related to bleach exposure. This represents direct lung injury rather than allergic reaction, explaining why steroid therapy has been ineffective. Chest x-ray shows stable scarring from previous pneumonia but no acute findings. Laboratory studies show mild elevation in BUN consistent with dehydration. Lymphocytosis may reflect inflammatory response. Plan: Administer high-dose injectable methylprednisolone (Depo-Medrol) 80mg  IM before leaving clinic Prescribe nebulizer with ipratropium-albuterol (DuoNeb) to be used every 4 hours as needed Advise complete cessation of bleach use and avoidance of all cleaning products with vapors for at least several days Recommend hot showers with steam inhalation to help dilute bleach in lungs Encourage  increased fluid intake to at least 8-10 glasses of water daily Consider Gatorade or similar electrolyte solutions to improve hydration and soothe throat Anticipate improvement within 24 hours of bleach cessation Risk of mild permanent lung damage explained to patient Moderate persistent asthma with exacerbation Moderate persistent asthma with exacerbation  (J45.41) Assessment: History of asthma with current exacerbation likely triggered by chemical irritant exposure. Symptoms include wheezy cough and chest tightness, consistent with bronchospasm. Previously prescribed albuterol has been used but with limited effect. Plan: Continue albuterol inhaler 2 puffs every 6 hours as needed DuoNeb treatments via nebulizer will provide additional bronchodilation Consider follow-up pulmonary function testing once acute episode resolves Discuss long-term asthma management at follow-up appointment Screening examination for infectious disease  Epigastric pain  Abnormal x-ray of abdomen Nephrolithiasis (N20.0) Assessment: Incidental finding of 4mm calcification in right upper quadrant on chest x-ray, consistent with small renal stone. Patient previously experienced significant epigastric pain which has resolved. Size suggests stone should pass spontaneously with adequate hydration. Plan: Encourage increased fluid intake to facilitate stone passage Discuss symptoms of stone passage (sudden flank pain radiating to groin) No urinalysis today since patient is asymptomatic from renal perspective Follow up if pain recurs or worsens Dehydration Assessment: Patient admits to poor fluid intake with infrequent urination. Examination reveals dry mucous membranes. Laboratory studies show elevated BUN and BUN/creatinine ratio consistent with pre-renal pattern. Plan: Educate on importance of adequate hydration, especially during respiratory illness Recommend 7-10 urinary outputs daily as target Encourage use of electrolyte solutions to improve taste and compliance Consider sports drinks (Gatorade) which may also soothe irritated throat  Follow-up Plan: Return in 2-3 days if symptoms have not improved Consider nebulizer purchase if symptoms persist beyond 24 hours Immediate return for worsening shortness of breath, inability to speak in complete sentences, or high  fever Routine follow-up in 2 weeks to ensure complete resolution Total face-to-face time spent with patient: 49 minutes, including counseling on diagnosis and management options, reviewing chest x-ray, performing lung examination, and coordinating care.      Orders Placed During this Encounter:   Orders Placed This Encounter  Procedures   For home use only DME Nebulizer machine    Patient needs a nebulizer to treat with the following condition:   COPD (chronic obstructive pulmonary disease) (HCC) [409811]    Length of Need:   Lifetime   Amylase   CBC with Differential/Platelet   Comp Met (CMET)   Hepatitis C Antibody   HIV antibody (with reflex)   Lipase   Meds ordered this encounter  Medications   Respiratory Therapy Supplies (NEBULIZER/TUBING/MOUTHPIECE) KIT    Sig: 1 each by Does not apply route every 4 (four) hours as needed.    Dispense:  1 kit    Refill:  1   ipratropium-albuterol (DUONEB) 0.5-2.5 (3) MG/3ML SOLN    Sig: Take 3 mLs by nebulization every 4 (four) hours as needed.    Dispense:  120 mL    Refill:  1   methylPREDNISolone acetate (DEPO-MEDROL) injection 80 mg     1. 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 Not requiring oxygen but with significant discomfort and potential for deterioration if exposure continues. DIAGNOSTIC IMPRESSIONS: Chemical pneumonitis from inhaled bleach vapors, exacerbated by mixing with other cleaning products. 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 STABLE CONDITIONS: Previous lung scarring from prior pneumonia remains unchanged on imaging. SOCIAL DETERMINANTS: Occupational exposure to cleaning chemicals. Limited health literacy regarding chemical  hazards. Language barrier partially affecting comprehension.  2. Diagnostic Results Summary Test Result Reference Range Clinical Significance  Chest X-ray Stable scarring at left base  [UNCHANGED] No acute infiltrates No evidence of acute pneumonia or pulmonary edema  WBC 9.3 [NORMAL] 3.8-10.8 K/uL No evidence of acute bacterial infection  Lymphocytes 4,204 [ELEVATED] 850-3,900 cells/uL Suggests inflammatory response, possibly viral or chemical  Oxygen Saturation 97% [NORMAL] >=95% Adequate oxygenation despite symptoms  3. Respiratory Status Assessment RESPIRATORY STATUS: Patient presenting with wheezy, nonproductive cough and subjective shortness of breath. Normal oxygen saturation at rest but with increased work of breathing during coughing episodes. Reports chest tightness.  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 Recently mixed bleach with Lysol, causing immediate eye and respiratory irritation. History of similar episodes in the past with seasonal increase in cleaning activities.  MONITORING REQUIRED: Follow respiratory status for 24-48 hours after bleach cessation. Anticipate improvement within this timeframe. If not improved, consider more intensive intervention.  4. Diagnostic Reasoning     Differential diagnoses include viral bronchitis, asthma exacerbation, atypical pneumonia, and chemical pneumonitis. Given the history of bleach exposure, lack of fever, normal WBC count, and failure to respond to prednisone, chemical pneumonitis is most likely. The temporal relationship between symptoms and chemical exposure is compelling.   Supporting Evidence: Patient demonstrates classic presentation of chemical pneumonitis with cough, chest tightness, and shortness of breath following bleach exposure. The symptom pattern has recurred with similar exposures in the past. Symptoms persisted despite prednisone, suggesting direct chemical injury rather than allergic mechanism.     Distinguishing chemical pneumonitis from asthma exacerbation based on treatment response and exposure history    Absence of fever and normal WBC count argue against  infectious process    Previous similar episodes with bleach exposure supports causal relationship 5. Treatment Plan COMPREHENSIVE PULMONARY TREATMENT PLAN   Immediate Interventions 1. Methylprednisolone acetate (Depo-Medrol) 80mg  IM injection administered in office 2. Complete cessation of all bleach and chemical cleaning product use 3. Begin nebulizer treatments with DuoNeb 3mL every 4 hours as needed   Supportive Measures 1. Hot steam showers to help dilute chemicals in the lungs 2. Increased fluid intake to support pulmonary clearance 3. Rest and avoidance of additional respiratory irritants 4. Alternative cleaning methods (vinegar, baking soda) recommended   Monitoring 1. Anticipate improvement within 24-48 hours of chemical cessation 2. Return if symptoms worsen or fail to improve 3. Consider pulmonary function testing at follow-up if symptoms persist   Patient Education 1. Explanation of chemical injury mechanism to lungs 2. Respiratory hazards of household chemicals, particularly when mixed 3. Safe cleaning alternatives provided 4. Warning signs requiring immediate medical attention   6. Warning Signs Requiring Immediate Evaluation SEEK IMMEDIATE MEDICAL ATTENTION FOR: Increased work of breathing or respiratory distress Inability to speak in complete sentences Confusion or decreased level of consciousness Bluish discoloration of lips or fingernails Fever greater than 101F developing after current visit    7. Care Coordination CARE TEAM COORDINATION: ?? Primary Care: Follow-up in 2 weeks to assess resolution of symptoms and need for additional pulmonary evaluation ?? DME Supplier: Prescription provided for nebulizer machine and DuoNeb solution ?? Interpreter Services: Utilized for visit to ensure understanding of diagnosis and treatment plan ?? Emergency Services: Patient instructed on when to seek emergency care versus return to clinic            **This document was  synthesized by artificial intelligence (Abridge) using HIPAA-compliant recording of the clinical interaction;   We  discussed the use of AI scribe software for clinical note transcription with the patient, who gave verbal consent to proceed.    Additional Info: This encounter employed state-of-the-art, real-time, collaborative documentation. The patient actively reviewed and assisted in updating their electronic medical record on a shared screen, ensuring transparency and facilitating joint problem-solving for the problem list, overview, and plan. This approach promotes accurate, informed care. The treatment plan was discussed and reviewed in detail, including medication safety, potential side effects, and all patient questions. We confirmed understanding and comfort with the plan. Follow-up instructions were established, including contacting the office for any concerns, returning if symptoms worsen, persist, or new symptoms develop, and precautions for potential emergency department visits.  ## MEDICAL DECISION MAKING ATTESTATION  ### Level of MDM: High Complexity  #### 1. Number and Complexity of Problems Addressed: Medical decision making demonstrates high complexity due to evaluation and management of chemical pneumonitis with respiratory distress, representing a condition with potential threat to bodily function. This condition is unresponsive to standard oral steroid therapy, requiring alternative intervention. The differential diagnosis included infectious pneumonia, asthma exacerbation, allergic reaction, and chemical pneumonitis. The diagnosis represents an acute illness with systemic symptoms (dyspnea, chest tightness, fatigue) with high risk of morbidity without appropriate treatment.  Additional problems managed during this encounter include: - Moderate persistent asthma with acute exacerbation - Nephrolithiasis (incidental finding requiring evaluation) - Dehydration with abnormal renal  function studies  #### 2. Amount and/or Complexity of Data to be Reviewed and Analyzed: Extensive data review conducted during this encounter included: - Independent interpretation of chest X-ray showing stable scarring but no acute pneumonia - Review and interpretation of abdominal X-ray findings showing renal calcification - Analysis of comprehensive laboratory panel including CBC with differential, comprehensive metabolic panel, amylase, and lipase - Review of previous medical records including medications and past diagnostic studies - Assessment of previous emergency department evaluation from 4 days prior  The integration of these multiple data elements was essential to establish the diagnosis of chemical pneumonitis rather than infectious process, and to differentiate from primary asthma exacerbation.  #### 3. Risk of Complications and/or Morbidity or Mortality: The risk level associated with this patient's presentation is high due to: - Potential for progressive respiratory compromise without appropriate intervention - Risk of permanent lung damage from continued chemical exposure - Decision regarding parenteral steroid administration requiring risk/benefit analysis - Management of potential drug-disease interactions in setting of respiratory compromise - Consideration of potential hospitalization if condition deteriorated  The management decisions made today carried significant risk considerations, particularly the decision to treat as an outpatient with high-dose injectable steroids rather than hospitalization, and the determination that the chemical exposure rather than infection was the primary etiology.  ### Clinical Reasoning: The diagnostic reasoning in this case required careful differentiation between multiple potential etiologies for the patient's respiratory symptoms. The lack of response to oral prednisone initially suggested a non-allergic process. The history of bleach  exposure, particularly with mixing of cleaning chemicals, strongly suggested chemical pneumonitis. The normal white blood cell count and absence of fever argued against infectious etiology.  Treatment decisions necessitated weighing the risks of parenteral steroid administration against the potential benefit of reducing airway inflammation, while recognizing that the primary intervention needed was cessation of the chemical exposure. The assessment that this could be safely managed as an outpatient required careful evaluation of the patient's overall clinical status, oxygen saturation, and absence of significant respiratory distress at rest.  ### Plan Complexity: The management plan required coordination of  multiple therapeutic approaches including: 1. Parenteral steroid administration 2. Prescription of nebulizer treatments 3. Environmental modification recommendations 4. Hydration strategy to address both pulmonary and renal issues 5. Clear contingency planning for clinical deterioration 6. Patient education regarding chemical hazards and alternative cleaning methods  Total face-to-face time spent: 49 minutes

## 2023-06-07 DIAGNOSIS — J680 Bronchitis and pneumonitis due to chemicals, gases, fumes and vapors: Secondary | ICD-10-CM

## 2023-06-07 DIAGNOSIS — N200 Calculus of kidney: Secondary | ICD-10-CM | POA: Insufficient documentation

## 2023-06-07 HISTORY — DX: Bronchitis and pneumonitis due to chemicals, gases, fumes and vapors: J68.0

## 2023-06-07 LAB — CBC WITH DIFFERENTIAL/PLATELET
Absolute Lymphocytes: 4204 {cells}/uL — ABNORMAL HIGH (ref 850–3900)
Absolute Monocytes: 586 {cells}/uL (ref 200–950)
Basophils Absolute: 19 {cells}/uL (ref 0–200)
Basophils Relative: 0.2 %
Eosinophils Absolute: 37 {cells}/uL (ref 15–500)
Eosinophils Relative: 0.4 %
HCT: 43 % (ref 35.0–45.0)
Hemoglobin: 14.2 g/dL (ref 11.7–15.5)
MCH: 28.2 pg (ref 27.0–33.0)
MCHC: 33 g/dL (ref 32.0–36.0)
MCV: 85.3 fL (ref 80.0–100.0)
MPV: 10.8 fL (ref 7.5–12.5)
Monocytes Relative: 6.3 %
Neutro Abs: 4455 {cells}/uL (ref 1500–7800)
Neutrophils Relative %: 47.9 %
Platelets: 231 10*3/uL (ref 140–400)
RBC: 5.04 10*6/uL (ref 3.80–5.10)
RDW: 12.4 % (ref 11.0–15.0)
Total Lymphocyte: 45.2 %
WBC: 9.3 10*3/uL (ref 3.8–10.8)

## 2023-06-07 LAB — COMPREHENSIVE METABOLIC PANEL
AG Ratio: 1.3 (calc) (ref 1.0–2.5)
ALT: 14 U/L (ref 6–29)
AST: 16 U/L (ref 10–35)
Albumin: 4.4 g/dL (ref 3.6–5.1)
Alkaline phosphatase (APISO): 60 U/L (ref 37–153)
BUN/Creatinine Ratio: 28 (calc) — ABNORMAL HIGH (ref 6–22)
BUN: 26 mg/dL — ABNORMAL HIGH (ref 7–25)
CO2: 27 mmol/L (ref 20–32)
Calcium: 9.4 mg/dL (ref 8.6–10.4)
Chloride: 103 mmol/L (ref 98–110)
Creat: 0.92 mg/dL (ref 0.50–1.03)
Globulin: 3.3 g/dL (ref 1.9–3.7)
Glucose, Bld: 86 mg/dL (ref 65–99)
Potassium: 3.7 mmol/L (ref 3.5–5.3)
Sodium: 141 mmol/L (ref 135–146)
Total Bilirubin: 0.3 mg/dL (ref 0.2–1.2)
Total Protein: 7.7 g/dL (ref 6.1–8.1)
eGFR: 73 mL/min/{1.73_m2} (ref >=60)

## 2023-06-07 LAB — HEPATITIS C ANTIBODY: Hepatitis C Ab: NONREACTIVE

## 2023-06-07 LAB — AMYLASE: Amylase: 32 U/L (ref 21–101)

## 2023-06-07 LAB — LIPASE: Lipase: 40 U/L (ref 7–60)

## 2023-06-07 LAB — HIV ANTIBODY (ROUTINE TESTING W REFLEX): HIV 1&2 Ab, 4th Generation: NONREACTIVE

## 2023-06-07 NOTE — Patient Instructions (Addendum)
 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 When you breathe in bleach vapors, the chemical dissolves and reforms inside your lungs. It directly damages the delicate tissue, causing inflammation, irritation, and chemical burns to the airways and lungs. Mixing bleach with other cleaning products (like you did with Lysol) can create even more harmful gases that cause additional damage. Lo que causa la neumonitis por leja: La leja (hipoclorito de sodio) es un qumico custico que puede daar el tejido pulmonar cuando se inhala. Cuando respira vapores de leja, el qumico se disuelve y se reforma dentro de sus pulmones. Daa directamente el delicado tejido, causando inflamacin, irritacin y quemaduras qumicas en las vas respiratorias y los pulmones. Mezclar leja con otros productos de limpieza (como hizo con Lysol) puede crear gases an ms dainos que causan dao adicional.        Expected recovery timeline: With complete avoidance of bleach and other chemical irritants, most patients begin to feel improvement within 24-48 hours. Coughing may continue for 5-7 days as your lungs heal and clear the damaged tissue. Complete healing typically takes 2-3 weeks. Most patients recover fully without permanent damage, but continued exposure can lead to chronic lung problems. Each time you are exposed to bleach, the lungs may become more sensitive, making symptoms worse with less exposure. Cronograma de recuperacin esperado: Con la evitacin completa de leja y  otros irritantes qumicos, la mayora de los pacientes comienzan a sentir mejora dentro de 24-48 horas. La tos puede continuar durante 5-7 [address] sus pulmones sanan y eliminan el tejido daado. La curacin completa tpicamente toma 2-3 semanas. La mayora de los pacientes se recuperan completamente sin dao [place], pero la exposicin continua puede llevar a problemas pulmonares crnicos. Cada vez que se expone a la leja, los pulmones pueden volverse ms sensibles, haciendo que los sntomas empeoren con menos exposicin.  TREATMENT PLAN / PLAN DE TRATAMIENTO Medications / Medicamentos Steroid injection given in office today Inyeccin de esteroide administrada hoy en la consulta  Will help reduce inflammation in your lungs / Ayudar a reducir la inflamacin en sus pulmones May cause temporary facial flushing or warmth / Puede causar enrojecimiento facial temporal o sensacin de calor  DuoNeb solution for nebulizer Solucin DuoNeb para nebulizador  Use 3 mL via nebulizer every 4 hours as needed for breathing problems / Use 3 mL a travs del nebulizador cada 4 horas segn sea necesario para problemas respiratorios Will help open airways and reduce coughing / Ayudar a abrir las vas respiratorias y reducir la tos  Albuterol inhaler Inhalador de albuterol  Use 2 puffs every 6 hours as needed for wheezing or shortness of breath / Use 2 inhalaciones cada 6 horas segn sea necesario para sibilancias o falta de aire  Home Care / Cuidado en Casa STOP using bleach immediately! DEJE de usar leja inmediatamente!  Do not use bleach for ANY cleaning purposes No use leja para NINGN propsito de limpieza  Avoid ALL cleaning products with strong vapors for at least 2-3 weeks Evite TODOS los productos de limpieza con vapores fuertes durante al menos 2-3  semanas  Use vinegar and baking soda as natural cleaning alternatives Use vinagre y bicarbonato de sodio como alternativas naturales de limpieza  Never  mix cleaning products together Nunca mezcle productos de limpieza  When you return to work, wear a mask and ensure good ventilation Cuando regrese al Aleen Campi, use [place] y asegure buena ventilacin    Increase your fluid intake Aumente su consumo de lquidos  Drink at least 8-10 glasses of water daily Beba al menos 8-10 vasos de agua diariamente  Try to urinate at least 7 times each day Intente orinar al menos 7 veces al da  Consider sports drinks like Gatorade to help with hydration Considere bebidas deportivas como Gatorade para ayudar con la hidratacin  Drinking more will help both your lungs and kidney stone Beber ms ayudar tanto a sus pulmones como a la piedra del rin    Take hot showers or use steam Tome duchas calientes o use vapor  The steam will help dilute the bleach chemicals in your lungs El vapor ayudar a diluir los qumicos de la leja en sus pulmones  Breathe in the steam deeply for 10-15 minutes Respire profundamente el vapor durante 10-15 minutos  Do this at least twice daily for the first week Haga esto al [employer] veces al da durante la primera semana    Use a nebulizer if needed Use un nebulizador si es necesario  If symptoms persist beyond 24 hours, use the prescribed nebulizer Si los sntomas persisten ms de 24 horas, use el nebulizador recetado  Use the [employer] solution as directed Use la solucin DuoNeb segn las indicaciones    Rest and avoid additional irritants Descanse y evite irritantes adicionales  Avoid smoke, dust, and strong smells Evite el humo, el polvo y los olores fuertes  Get plenty of rest to allow your lungs to heal Descanse bastante para permitir que sus pulmones sanen  About Your Kidney Stone / Sobre Su Piedra en el Rin Small 4mm stone that should pass naturally Pequea piedra de 4mm que debera pasar naturalmente  You may experience sudden pain from your side down to your groin when the stone passes Puede experimentar dolor repentino  desde su costado hasta la ingle cuando pase la piedra  Drinking more fluids will help the stone pass more easily Beber ms lquidos ayudar a que la piedra pase ms fcilmente  FOLLOW-UP CARE / CUIDADO DE SEGUIMIENTO If not improved in 2-3 days, return to clinic Si no mejora en 2-3 das, regrese a Glass blower/designer  Routine follow-up in 2 weeks to ensure complete recovery Seguimiento rutinario en 2 semanas para asegurar una recuperacin completa  RETURN IMMEDIATELY IF YOU EXPERIENCE: / REGRESE INMEDIATAMENTE SI EXPERIMENTA: Severe difficulty breathing / Dificultad severa para respirar Unable to speak in full sentences / Incapaz de hablar en oraciones completas Confusion or extreme fatigue / Confusin o fatiga extrema High fever / Fiebre alta Severe pain in your side or abdomen / Dolor severo en su costado o abdomen    ADDITIONAL INSTRUCTIONS / INSTRUCCIONES ADICIONALES Wash fruits and vegetables with special produce wash, NEVER with bleach Lave frutas y verduras con un lavado especial para productos, NUNCA con leja  You can keep your houseplants - they are not causing your symptoms Puede mantener sus plantas de interior - no estn causando sus sntomas  Remember to take all medications as prescribed Recuerde tomar todos los medicamentos segn lo prescrito    If you have any questions or concerns, please call  our office. / Si tiene alguna pregunta o inquietud, por favor llame a nuestra oficina.   Pulmonary Evaluation Note 1. 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 Not requiring oxygen but with significant discomfort and potential for deterioration if exposure continues. DIAGNOSTIC IMPRESSIONS: Chemical pneumonitis from inhaled bleach vapors, exacerbated by mixing with other cleaning products. 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 STABLE CONDITIONS: Previous lung scarring from prior pneumonia  remains unchanged on imaging. SOCIAL DETERMINANTS: Occupational exposure to cleaning chemicals. Limited health literacy regarding chemical hazards. Language barrier partially affecting comprehension.  2. Diagnostic Results Summary Test Result Reference Range Clinical Significance  Chest X-ray Stable scarring at left base [UNCHANGED] No acute infiltrates No evidence of acute pneumonia or pulmonary edema  WBC 9.3 [NORMAL] 3.8-10.8 K/uL No evidence of acute bacterial infection  Lymphocytes 4,204 [ELEVATED] 850-3,900 cells/uL Suggests inflammatory response, possibly viral or chemical  Oxygen Saturation 97% [NORMAL] >=95% Adequate oxygenation despite symptoms  3. Respiratory Status Assessment RESPIRATORY STATUS: Patient presenting with wheezy, nonproductive cough and subjective shortness of breath. Normal oxygen saturation at rest but with increased work of breathing during coughing episodes. Reports chest tightness.  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 Recently mixed bleach with Lysol, causing immediate eye and respiratory irritation. History of similar episodes in the past with seasonal increase in cleaning activities.  MONITORING REQUIRED: Follow respiratory status for 24-48 hours after bleach cessation. Anticipate improvement within this timeframe. If not improved, consider more intensive intervention.  4. Diagnostic Reasoning     Differential diagnoses include viral bronchitis, asthma exacerbation, atypical pneumonia, and chemical pneumonitis. Given the history of bleach exposure, lack of fever, normal WBC count, and failure to respond to prednisone, chemical pneumonitis is most likely. The temporal relationship between symptoms and chemical exposure is compelling.   Supporting Evidence: Patient demonstrates classic presentation of chemical pneumonitis with cough, chest tightness, and shortness of breath following bleach exposure. The symptom pattern  has recurred with similar exposures in the past. Symptoms persisted despite prednisone, suggesting direct chemical injury rather than allergic mechanism.     Distinguishing chemical pneumonitis from asthma exacerbation based on treatment response and exposure history    Absence of fever and normal WBC count argue against infectious process    Previous similar episodes with bleach exposure supports causal relationship 5. Treatment Plan COMPREHENSIVE PULMONARY TREATMENT PLAN   Immediate Interventions 1. Methylprednisolone acetate (Depo-Medrol) 80mg  IM injection administered in office 2. Complete cessation of all bleach and chemical cleaning product use 3. Begin nebulizer treatments with DuoNeb 3mL every 4 hours as needed   Supportive Measures 1. Hot steam showers to help dilute chemicals in the lungs 2. Increased fluid intake to support pulmonary clearance 3. Rest and avoidance of additional respiratory irritants 4. Alternative cleaning methods (vinegar, baking soda) recommended   Monitoring 1. Anticipate improvement within 24-48 hours of chemical cessation 2. Return if symptoms worsen or fail to improve 3. Consider pulmonary function testing at follow-up if symptoms persist   Patient Education 1. Explanation of chemical injury mechanism to lungs 2. Respiratory hazards of household chemicals, particularly when mixed 3. Safe cleaning alternatives provided 4. Warning signs requiring immediate medical attention   6. Warning Signs Requiring Immediate Evaluation SEEK IMMEDIATE MEDICAL ATTENTION FOR: Increased work of breathing or respiratory distress Inability to speak in complete sentences Confusion or decreased level of consciousness Bluish discoloration of lips or fingernails Fever greater than 101F developing after current visit  7. Care Coordination CARE TEAM COORDINATION: ?? Primary Care: Follow-up in 2 weeks to assess resolution of symptoms and need for additional pulmonary  evaluation ?? DME Supplier: Prescription provided for nebulizer machine and DuoNeb solution ?? Interpreter Services: Utilized for visit to ensure understanding of diagnosis and treatment plan ?? Emergency Services: Patient instructed on when to seek emergency care versus return to clinic

## 2023-06-07 NOTE — Assessment & Plan Note (Signed)
 Assessment: Patient presents with persistent cough, chest tightness, and shortness of breath unresponsive to prednisone therapy. Clinical presentation strongly suggests chemical pneumonitis from bleach vapor inhalation. Patient confirms regular use of bleach for cleaning and admits to mixing bleach with other cleaning products. She has a history of similar symptoms in the past related to bleach exposure. This represents direct lung injury rather than allergic reaction, explaining why steroid therapy has been ineffective. Chest x-ray shows stable scarring from previous pneumonia but no acute findings. Laboratory studies show mild elevation in BUN consistent with dehydration. Lymphocytosis may reflect inflammatory response. Plan: Administer high-dose injectable methylprednisolone (Depo-Medrol) 80mg  IM before leaving clinic Prescribe nebulizer with ipratropium-albuterol (DuoNeb) to be used every 4 hours as needed Advise complete cessation of bleach use and avoidance of all cleaning products with vapors for at least several days Recommend hot showers with steam inhalation to help dilute bleach in lungs Encourage increased fluid intake to at least 8-10 glasses of water daily Consider Gatorade or similar electrolyte solutions to improve hydration and soothe throat Anticipate improvement within 24 hours of bleach cessation Risk of mild permanent lung damage explained to patient

## 2023-08-29 ENCOUNTER — Ambulatory Visit: Payer: Self-pay

## 2023-08-29 NOTE — Telephone Encounter (Signed)
 Patient scheduled for 09/03/23

## 2023-08-29 NOTE — Telephone Encounter (Signed)
 FYI Only or Action Required?: FYI only for provider  Patient was last seen in primary care on 06/06/2023 by Anthon Kins, MD. Called Nurse Triage reporting Headache. Symptoms began several months ago. Interventions attempted: OTC medications: tylenol, ibuprofen and Prescription medications: mobic. Symptoms are: unchanged.  Triage Disposition: See PCP Within 2 Weeks  Patient/caregiver understands and will follow disposition?: Yes        Copied from CRM [id number]. Topic: Clinical - Red Word Triage >> Aug 29, 2023  3:20 PM Alyse July wrote: Red Word that prompted transfer to Nurse Triage: bone pain,body aches,hair loss and headaches for 1 month. Reason for Disposition  Headache is a chronic symptom (recurrent or ongoing AND present > 4 weeks)  Answer Assessment - Initial Assessment Questions 1. LOCATION: Where does it hurt?      Back of head 2. ONSET: When did the headache start? (Minutes, hours or days)     Month and half 3. PATTERN: Does the pain come and go, or has it been constant since it started?     Comes and goes 4. SEVERITY: How bad is the pain? and What does it keep you from doing?  (e.g., Scale 1-10; mild, moderate, or severe)   - MILD (1-3): doesn't interfere with normal activities    - MODERATE (4-7): interferes with normal activities or awakens from sleep    - SEVERE (8-10): excruciating pain, unable to do any normal activities        6/10 5. RECURRENT SYMPTOM: Have you ever had headaches before? If Yes, ask: When was the last time? and What happened that time?      daily 6. CAUSE: What do you think is causing the headache?     hormones 7. MIGRAINE: Have you been diagnosed with migraine headaches? If Yes, ask: Is this headache similar?      Yes, but not similiar 8. HEAD INJURY: Has there been any recent injury to the head?      no 9. OTHER SYMPTOMS: Do you have any other symptoms? (fever, stiff neck, eye pain, sore throat, cold symptoms)      no  Protocols used: Headache-A-AH

## 2023-09-02 ENCOUNTER — Telehealth: Payer: Self-pay

## 2023-09-02 NOTE — Telephone Encounter (Signed)
 Telephoned patient using interpreter, Kathryn Salas. Patient requested a mammogram scholarship application. Patient's address confirmed. [facility]

## 2023-09-03 ENCOUNTER — Ambulatory Visit (INDEPENDENT_AMBULATORY_CARE_PROVIDER_SITE_OTHER): Payer: Self-pay | Admitting: Internal Medicine

## 2023-09-03 ENCOUNTER — Encounter: Payer: Self-pay | Admitting: Internal Medicine

## 2023-09-03 VITALS — BP 120/72 | HR 90 | Temp 98.0°F | Ht 64.0 in | Wt 151.6 lb

## 2023-09-03 DIAGNOSIS — R682 Dry mouth, unspecified: Secondary | ICD-10-CM

## 2023-09-03 DIAGNOSIS — Z758 Other problems related to medical facilities and other health care: Secondary | ICD-10-CM

## 2023-09-03 DIAGNOSIS — R4189 Other symptoms and signs involving cognitive functions and awareness: Secondary | ICD-10-CM

## 2023-09-03 DIAGNOSIS — E559 Vitamin D deficiency, unspecified: Secondary | ICD-10-CM

## 2023-09-03 DIAGNOSIS — R52 Pain, unspecified: Secondary | ICD-10-CM

## 2023-09-03 DIAGNOSIS — R070 Pain in throat: Secondary | ICD-10-CM

## 2023-09-03 DIAGNOSIS — L608 Other nail disorders: Secondary | ICD-10-CM

## 2023-09-03 DIAGNOSIS — R519 Headache, unspecified: Secondary | ICD-10-CM

## 2023-09-03 DIAGNOSIS — Z603 Acculturation difficulty: Secondary | ICD-10-CM

## 2023-09-03 DIAGNOSIS — R5383 Other fatigue: Secondary | ICD-10-CM

## 2023-09-03 NOTE — Progress Notes (Unsigned)
 ==============================  [facility]: [phone]   -- Medical Office Visit --  Patient: Kathryn Salas      Age: 58 y.o.       Sex:  female  Date:   09/03/2023 Today's Healthcare Provider: Anthon Kins, MD  ==============================   Chief Complaint: Fatigue (Over mth.), loss of hair (For long time now.), and Headache (States that it comes and goes with ears and throat but hurts very bad at times.)  Discussed the use of AI scribe software for clinical note transcription with the patient, who gave verbal consent to proceed.  History of Present Illness Kathryn Salas is a 58 year old female who presents with symptoms suggestive of a thyroid disorder.  She has been experiencing significant headaches for over a month, which are not typical migraines and can occur in various areas of her head. The headaches sometimes occur at rest and are exacerbated by stress. She has tried ibuprofen, meloxicam, and naproxen without relief.  She reports body aches, fatigue, hair loss, and brittle nails. 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 Her hair is falling out, and her nails break easily.  She describes difficulty concentrating, feeling as though her brain is 'weird' and unable to focus. She experiences episodes of dry mouth, a bitter taste, and occasional swelling in her hands. She also feels cold sensitivity, stating she feels 'frozen' even when others do not.  She mentions a history of arthritis and self-medicates for body pain. She reports irregular bowel movements, experiencing constipation.  She has experienced throat discomfort, describing it as a pinching sensation that comes and goes, but denies consistent throat pain. She also mentions past pelvic pain, which has since resolved.  She takes trazodone at night and has a history of low vitamin D levels, for which  she previously took supplements.  Background Reviewed: Problem List: has Gastroesophageal reflux disease; Chronic back pain; History of colonic polyps; Recurrent pneumonia; History of asthma; Post-nasal drip; Chronic maxillary sinusitis; Allergic rhinitis; ETD (Eustachian tube dysfunction), bilateral; Language barrier affecting health care; Post-COVID chronic cough; Pneumonitis due to vapors [facility]); and Nephrolithiasis on their problem list. Past Medical History:  has a past medical history of Anxiety, Arthritis, Asthma, Fatty liver, History of kidney stones, Migraines, Pneumonia, and Screening breast examination (02/16/2019). Past Surgical History:   has no past surgical history on file. Social History:   reports that she has never smoked. She has never used smokeless tobacco. She reports that she does not drink alcohol and does not use drugs. Family History:  family history includes Breast cancer in her maternal aunt; Cancer in her father, maternal aunt, and paternal grandmother; Depression in her mother; Hypertension in her mother. Allergies:  is allergic to codeine, morphine and codeine, latex, and pamelor [nortriptyline hcl].   Medication Reconciliation: Current Outpatient Medications on File Prior to Visit  Medication Sig   albuterol  (VENTOLIN  HFA) 108 (90 Base) MCG/ACT inhaler Inhale 2 puffs into the lungs every 6 (six) hours as needed for wheezing or shortness of breath.   fluticasone  (FLONASE ) 50 MCG/ACT nasal spray Place 2 sprays into both nostrils daily. Use after simply saline sinus rinse at night   ibuprofen (ADVIL) 800 MG tablet Take by mouth as needed for headache.   ipratropium-albuterol  (DUONEB) 0.5-2.5 (3) MG/3ML SOLN Take 3 mLs by nebulization every 4 (four) hours as needed.   loratadine  (CLARITIN ) 10 MG tablet Take 1  tablet (10 mg total) by mouth daily.   MELOXICAM PO Take by mouth as needed.   promethazine -dextromethorphan  (PROMETHAZINE -DM) 6.25-15 MG/5ML syrup Take 5 mLs by  mouth 4 (four) times daily as needed.   Respiratory Therapy Supplies (NEBULIZER/TUBING/MOUTHPIECE) KIT 1 each by Does not apply route every 4 (four) hours as needed.   azithromycin  (ZITHROMAX ) 250 MG tablet Take 250 mg by mouth daily. (Patient not taking: Reported on 06/06/2023)   benzonatate  (TESSALON ) 100 MG capsule Take by mouth 3 (three) times daily as needed for cough. (Patient not taking: Reported on 06/06/2023)   Cholecalciferol 125 MCG (5000 UT) TABS Take by mouth. (Patient not taking: Reported on 06/06/2023)   famotidine (PEPCID) 40 MG tablet Take 40 mg by mouth daily. (Patient not taking: Reported on 06/06/2023)   fluticasone -salmeterol (ADVAIR) 250-50 MCG/ACT AEPB SMARTSIG:By Mouth (Patient not taking: Reported on 06/06/2023)   levalbuterol  (XOPENEX  HFA) 45 MCG/ACT inhaler Inhale 1 puff into the lungs every 4 (four) hours as needed for wheezing. Take as rescue inhaler for shortness of breath or wheezing. (Patient not taking: Reported on 06/06/2023)   montelukast (SINGULAIR) 10 MG tablet Take 10 mg by mouth daily. (Patient not taking: Reported on 06/06/2023)   omeprazole (PRILOSEC) 40 MG capsule Take 40 mg by mouth daily. (Patient not taking: Reported on 06/06/2023)   Saline (SIMPLY SALINE) 0.9 % AERS Place 2 each into the nose as directed. Use nightly for sinus hygiene long-term.  Can also be used as many times daily as desired to assist with clearing congested sinuses. (Patient not taking: Reported on 06/06/2023)   traZODone (DESYREL) 50 MG tablet Take 50 mg by mouth at bedtime. (Patient not taking: Reported on 06/06/2023)   vitamin E 1000 UNIT capsule Take by mouth daily. Not sure of dosage (Patient not taking: Reported on 06/06/2023)   No current facility-administered medications on file prior to visit.  There are no discontinued medications.   Physical Exam:    09/03/2023    4:06 PM 06/06/2023    4:00 PM 06/01/2023    8:12 AM  Vitals with BMI  Height 5' 4 5' 4   Weight 151 lbs 10 oz 146  lbs 13 oz   BMI 26.01 25.19   Systolic 120 123 161  Diastolic 72 70 70  Pulse 90 75 104  Vital signs reviewed.  Nursing notes reviewed. Weight trend reviewed. Physical Exam General Appearance:  No acute distress appreciable.   Well-groomed, healthy-appearing female.  Well proportioned with no abnormal fat distribution.  Good muscle tone. Pulmonary:  Normal work of breathing at rest, no respiratory distress apparent. SpO2: 98 %  Musculoskeletal: All extremities are intact.  Neurological:  Awake, alert, oriented, and engaged.  No obvious focal neurological deficits or cognitive impairments.  Sensorium seems unclouded.   Speech is clear and coherent with logical content. Psychiatric:  Appropriate mood, pleasant and cooperative demeanor, thoughtful and engaged during the exam Physical Exam Finger nails cracking, tired appearing   Results:    06/06/2023    4:09 PM 07/03/2022    8:27 AM  PHQ 2/9 Scores  PHQ - 2 Score 0 0   Results     No results found for any visits on 09/03/23. Office Visit on 06/06/2023  Component Date Value Ref Range Status   Amylase 06/06/2023 32  21 - 101 U/L Final   WBC 06/06/2023 9.3  3.8 - 10.8 Thousand/uL Final   RBC 06/06/2023 5.04  3.80 - 5.10 Million/uL Final   Hemoglobin 06/06/2023 14.2  11.7 - 15.5 g/dL Final   HCT 40/98/1191 43.0  35.0 - 45.0 % Final   MCV 06/06/2023 85.3  80.0 - 100.0 fL Final   MCH 06/06/2023 28.2  27.0 - 33.0 pg Final   MCHC 06/06/2023 33.0  32.0 - 36.0 g/dL Final   RDW 47/82/9562 12.4  11.0 - 15.0 % Final   Platelets 06/06/2023 231  140 - 400 Thousand/uL Final   MPV 06/06/2023 10.8  7.5 - 12.5 fL Final   Neutro Abs 06/06/2023 4,455  1,500 - 7,800 cells/uL Final   Absolute Lymphocytes 06/06/2023 4,204 (H)  850 - 3,900 cells/uL Final   Absolute Monocytes 06/06/2023 586  200 - 950 cells/uL Final   Eosinophils Absolute 06/06/2023 37  15 - 500 cells/uL Final   Basophils Absolute 06/06/2023 19  0 - 200 cells/uL Final   Neutrophils  Relative % 06/06/2023 47.9  % Final   Total Lymphocyte 06/06/2023 45.2  % Final   Monocytes Relative 06/06/2023 6.3  % Final   Eosinophils Relative 06/06/2023 0.4  % Final   Basophils Relative 06/06/2023 0.2  % Final   Glucose, Bld 06/06/2023 86  65 - 99 mg/dL Final   BUN 13/10/6576 26 (H)  7 - 25 mg/dL Final   Creat 46/96/2952 0.92  0.50 - 1.03 mg/dL Final   eGFR 84/13/2440 73  > OR = 60 mL/min/1.62m2 Final   BUN/Creatinine Ratio 06/06/2023 28 (H)  6 - 22 (calc) Final   Sodium 06/06/2023 141  135 - 146 mmol/L Final   Potassium 06/06/2023 3.7  3.5 - 5.3 mmol/L Final   Chloride 06/06/2023 103  98 - 110 mmol/L Final   CO2 06/06/2023 27  20 - 32 mmol/L Final   Calcium 06/06/2023 9.4  8.6 - 10.4 mg/dL Final   Total Protein 01/12/2535 7.7  6.1 - 8.1 g/dL Final   Albumin 64/40/3474 4.4  3.6 - 5.1 g/dL Final   Globulin 25/95/6387 3.3  1.9 - 3.7 g/dL (calc) Final   AG Ratio 06/06/2023 1.3  1.0 - 2.5 (calc) Final   Total Bilirubin 06/06/2023 0.3  0.2 - 1.2 mg/dL Final   Alkaline phosphatase (APISO) 06/06/2023 60  37 - 153 U/L Final   AST 06/06/2023 16  10 - 35 U/L Final   ALT 06/06/2023 14  6 - 29 U/L Final   Hepatitis C Ab 06/06/2023 NON-REACTIVE  NON-REACTIVE Final   HIV 1&2 Ab, 4th Generation 06/06/2023 NON-REACTIVE  NON-REACTIVE Final   Lipase 06/06/2023 40  7 - 60 U/L Final  Admission on 11/12/2022, Discharged on 11/12/2022  Component Date Value Ref Range Status   Color, UA 11/12/2022 yellow  yellow Final   Clarity, UA 11/12/2022 hazy (A)  clear Final   Glucose, UA 11/12/2022 negative  negative mg/dL Final   Bilirubin, UA 56/43/3295 negative  negative Final   Ketones, POC UA 11/12/2022 negative  negative mg/dL Final   Spec Grav, UA 18/84/1660 >=1.030 (A)  1.010 - 1.025 Final   Blood, UA 11/12/2022 trace-intact (A)  negative Final   pH, UA 11/12/2022 5.5  5.0 - 8.0 Final   Protein Ur, POC 11/12/2022 negative  negative mg/dL Final   Urobilinogen, UA 11/12/2022 0.2  0.2 or 1.0 E.U./dL  Final   Nitrite, UA 63/03/6008 Negative  Negative Final   Leukocytes, UA 11/12/2022 Negative  Negative Final   Specimen Description 11/12/2022    Final                   Value:URINE, CLEAN  CATCH Performed at [facility], [address] [place], [place] [address]    Special Requests 11/12/2022    Final                   Value:NONE Performed at [facility], [address], [place], [place] [address]    Culture 11/12/2022    Final                   Value:NO GROWTH Performed at [facility], [address] [place], [place] [address]    Report Status 11/12/2022 11/13/2022 FINAL   Final  Office Visit on 07/03/2022  Component Date Value Ref Range Status   COLOGUARD 09/05/2022 Negative  Negative Final  No image results found. No results found.       Assessment & Plan Other fatigue Symptoms such as fatigue, hair loss, brittle nails, difficulty concentrating, dry mouth, cold sensitivity, and constipation suggest Hashimoto's Thyroiditis. These symptoms are episodic with exacerbations. An autoimmune thyroid attack is suspected, which may lead to complete thyroid destruction if untreated. Although severe vitamin deficiency is considered, symptoms strongly indicate thyroid dysfunction. Without treatment, total thyroid destruction and dependence on thyroid hormone supplementation is expected. Blood work will confirm the diagnosis. If confirmed, start thyroid hormone replacement therapy, which should normalize symptoms within weeks. Order a thyroid panel and complete blood count. Recommend follow-up in 4-6 weeks to reassess symptoms and adjust treatment as necessary. Nonintractable headache, unspecified chronicity pattern, unspecified headache type She reports headaches varying in location, exacerbated by stress. It is uncertain if these are related to thyroid dysfunction. Addressing the thyroid issue first is suggested to see if headaches resolve. She has tried ibuprofen, meloxicam, and naproxen  without relief. Provide samples of migraine medication for trial. Consider prescribing migraine medication if samples are effective. Body aches Will defer expansive workup until we rule in or out most likely diagnosis hypothyroidism Change in nail appearance  Brain fog  Throat pain  Dry mouth  Vitamin D deficiency Low vitamin D levels are noted. She is advised to continue supplementation with a recommended daily intake of 2000-5000 IU unless there is high dairy consumption. Checking vitamin D levels as part of a comprehensive workup is suggested if desired, but supplementation is emphasized regardless of lab results. Language barrier affecting health care Interpreter used, daughter present who also supports and interprets.        Orders Placed in Encounter:   Lab Orders         Thyroid Panel With TSH         CBC with Differential/Platelet         Vitamin D (25 hydroxy)         This document was synthesized by artificial intelligence ([employer]) using HIPAA-compliant recording of the clinical interaction;   We discussed the use of AI scribe software for clinical note transcription with the patient, who gave verbal consent to proceed. additional Info: This encounter employed state-of-the-art, real-time, collaborative documentation. The patient actively reviewed and assisted in updating their electronic medical record on a shared screen, ensuring transparency and facilitating joint problem-solving for the problem list, overview, and plan. This approach promotes accurate, informed care. The treatment plan was discussed and reviewed in detail, including medication safety, potential side effects, and all patient questions. We confirmed understanding and comfort with the plan. Follow-up instructions were established, including contacting the office for any concerns, returning if symptoms worsen, persist, or new symptoms develop, and precautions for potential emergency department  visits.

## 2023-09-04 ENCOUNTER — Other Ambulatory Visit (HOSPITAL_COMMUNITY)
Admission: RE | Admit: 2023-09-04 | Discharge: 2023-09-04 | Disposition: A | Discharge status: Home / Self Care | Payer: Self-pay | Source: Ambulatory Visit | Attending: Internal Medicine | Admitting: Internal Medicine

## 2023-09-04 ENCOUNTER — Telehealth: Payer: Self-pay | Admitting: Internal Medicine

## 2023-09-04 DIAGNOSIS — R4189 Other symptoms and signs involving cognitive functions and awareness: Secondary | ICD-10-CM | POA: Insufficient documentation

## 2023-09-04 DIAGNOSIS — R519 Headache, unspecified: Secondary | ICD-10-CM | POA: Insufficient documentation

## 2023-09-04 DIAGNOSIS — R52 Pain, unspecified: Secondary | ICD-10-CM | POA: Insufficient documentation

## 2023-09-04 DIAGNOSIS — R682 Dry mouth, unspecified: Secondary | ICD-10-CM | POA: Insufficient documentation

## 2023-09-04 DIAGNOSIS — L608 Other nail disorders: Secondary | ICD-10-CM | POA: Insufficient documentation

## 2023-09-04 DIAGNOSIS — R070 Pain in throat: Secondary | ICD-10-CM | POA: Insufficient documentation

## 2023-09-04 LAB — CBC WITH DIFFERENTIAL/PLATELET
Abs Immature Granulocytes: 0.01 10*3/uL (ref 0.00–0.07)
Basophils Absolute: 0 10*3/uL (ref 0.0–0.1)
Basophils Relative: 0 %
Eosinophils Absolute: 0.2 10*3/uL (ref 0.0–0.5)
Eosinophils Relative: 3 %
HCT: 39.9 % (ref 36.0–46.0)
Hemoglobin: 13.6 g/dL (ref 12.0–15.0)
Immature Granulocytes: 0 %
Lymphocytes Relative: 31 %
Lymphs Abs: 2.1 10*3/uL (ref 0.7–4.0)
MCH: 30 pg (ref 26.0–34.0)
MCHC: 34.1 g/dL (ref 30.0–36.0)
MCV: 87.9 fL (ref 80.0–100.0)
Monocytes Absolute: 0.5 10*3/uL (ref 0.1–1.0)
Monocytes Relative: 7 %
Neutro Abs: 4 10*3/uL (ref 1.7–7.7)
Neutrophils Relative %: 59 %
Platelets: 211 10*3/uL (ref 150–400)
RBC: 4.54 MIL/uL (ref 3.87–5.11)
RDW: 12.2 % (ref 11.5–15.5)
WBC: 6.8 10*3/uL (ref 4.0–10.5)
nRBC: 0 % (ref 0.0–0.2)

## 2023-09-04 LAB — VITAMIN D 25 HYDROXY (VIT D DEFICIENCY, FRACTURES): Vit D, 25-Hydroxy: 25.75 ng/mL — ABNORMAL LOW (ref 30–100)

## 2023-09-04 NOTE — Assessment & Plan Note (Signed)
 Interpreter used, daughter present who also supports and interprets.

## 2023-09-04 NOTE — Telephone Encounter (Signed)
 Patient's daughter called in requesting to have recent lab orders from 09/03/23 sent to [facility] in [place] so patient can be drawn there.

## 2023-09-05 LAB — THYROID PANEL WITH TSH
Free Thyroxine Index: 2.5 (ref 1.2–4.9)
T3 Uptake Ratio: 28 % (ref 24–39)
T4, Total: 9.1 ug/dL (ref 4.5–12.0)
TSH: 1.28 u[IU]/mL (ref 0.450–4.500)

## 2023-09-05 NOTE — Telephone Encounter (Signed)
 Looks like you have already did this

## 2023-09-07 ENCOUNTER — Ambulatory Visit: Payer: Self-pay | Admitting: Internal Medicine

## 2023-09-09 NOTE — Telephone Encounter (Signed)
 read by Brent Ambrosia at 3:33PM on 09/09/2023.

## 2023-09-17 ENCOUNTER — Other Ambulatory Visit: Payer: Self-pay

## 2023-09-17 ENCOUNTER — Other Ambulatory Visit (HOSPITAL_COMMUNITY)
Admission: RE | Admit: 2023-09-17 | Discharge: 2023-09-17 | Disposition: A | Discharge status: Home / Self Care | Payer: Self-pay | Source: Ambulatory Visit | Attending: Internal Medicine | Admitting: Internal Medicine

## 2023-09-17 ENCOUNTER — Ambulatory Visit: Payer: Self-pay | Admitting: Internal Medicine

## 2023-09-17 DIAGNOSIS — E559 Vitamin D deficiency, unspecified: Secondary | ICD-10-CM | POA: Insufficient documentation

## 2023-09-17 DIAGNOSIS — R52 Pain, unspecified: Secondary | ICD-10-CM | POA: Insufficient documentation

## 2023-09-17 DIAGNOSIS — R5383 Other fatigue: Secondary | ICD-10-CM

## 2023-09-17 DIAGNOSIS — R519 Headache, unspecified: Secondary | ICD-10-CM | POA: Insufficient documentation

## 2023-09-17 LAB — VITAMIN D 25 HYDROXY (VIT D DEFICIENCY, FRACTURES): Vit D, 25-Hydroxy: 25.36 ng/mL — ABNORMAL LOW (ref 30–100)

## 2023-09-17 LAB — FOLATE: Folate: 16.8 ng/mL (ref >5.9)

## 2023-09-17 LAB — C-REACTIVE PROTEIN: CRP: 0.7 mg/dL (ref <1.0)

## 2023-09-17 LAB — VITAMIN B12: Vitamin B-12: 319 pg/mL (ref 180–914)

## 2023-09-17 LAB — SEDIMENTATION RATE: Sed Rate: 30 mm/h (ref 0–30)

## 2023-09-18 LAB — THYROID PANEL WITH TSH
Free Thyroxine Index: 2 (ref 1.2–4.9)
T3 Uptake Ratio: 24 % (ref 24–39)
T4, Total: 8.2 ug/dL (ref 4.5–12.0)
TSH: 1.49 u[IU]/mL (ref 0.450–4.500)

## 2023-09-18 LAB — RHEUMATOID FACTOR: Rheumatoid fact SerPl-aCnc: <10 [IU]/mL (ref <14.0)

## 2023-09-18 LAB — CYCLIC CITRUL PEPTIDE ANTIBODY, IGG/IGA: CCP Antibodies IgG/IgA: 11 U (ref 0–19)

## 2023-09-19 ENCOUNTER — Ambulatory Visit: Payer: Self-pay | Admitting: Internal Medicine

## 2023-09-19 DIAGNOSIS — E559 Vitamin D deficiency, unspecified: Secondary | ICD-10-CM

## 2023-09-19 DIAGNOSIS — E569 Vitamin deficiency, unspecified: Secondary | ICD-10-CM

## 2023-09-19 MED ORDER — CENTRUM MINIS WOMEN 50+ PO TABS: 1.0000 | ORAL_TABLET | Freq: Every day | ORAL | 3 refills | Status: AC

## 2023-09-19 MED ORDER — VITAMIN D (ERGOCALCIFEROL) 1.25 MG (50000 UNIT) PO CAPS: 50000.0000 [IU] | ORAL_CAPSULE | ORAL | 1 refills | Status: AC

## 2023-10-03 ENCOUNTER — Ambulatory Visit (INDEPENDENT_AMBULATORY_CARE_PROVIDER_SITE_OTHER): Payer: Self-pay | Admitting: Internal Medicine

## 2023-10-03 ENCOUNTER — Encounter: Payer: Self-pay | Admitting: Internal Medicine

## 2023-10-03 ENCOUNTER — Ambulatory Visit

## 2023-10-03 VITALS — BP 122/68 | HR 84 | Temp 98.0°F | Ht 64.0 in | Wt 149.0 lb

## 2023-10-03 DIAGNOSIS — K1379 Other lesions of oral mucosa: Secondary | ICD-10-CM

## 2023-10-03 DIAGNOSIS — E569 Vitamin deficiency, unspecified: Secondary | ICD-10-CM

## 2023-10-03 DIAGNOSIS — R0609 Other forms of dyspnea: Secondary | ICD-10-CM

## 2023-10-03 DIAGNOSIS — R0789 Other chest pain: Secondary | ICD-10-CM

## 2023-10-03 DIAGNOSIS — R5383 Other fatigue: Secondary | ICD-10-CM

## 2023-10-03 DIAGNOSIS — B589 Toxoplasmosis, unspecified: Secondary | ICD-10-CM

## 2023-10-03 DIAGNOSIS — M2550 Pain in unspecified joint: Secondary | ICD-10-CM

## 2023-10-03 NOTE — Progress Notes (Signed)
 ==============================  [facility]: [phone]   -- Medical Office Visit --  Patient: Kathryn Salas      Age: 58 y.o.       Sex:  female  Date:   10/03/2023 Today's Healthcare Provider: Bernardino KANDICE Cone, MD  ==============================   Chief Complaint: Headache and Fatigue (Really tired now no energy has had some numbness in left arm and had tightness in the chest comes and goes.) Headache now mostly gone.  Discussed the use of AI scribe software for clinical note transcription with the patient, who gave verbal consent to proceed.  History of Present Illness  58 year old female with a complex medical history including asthma, chronic sinusitis, GERD, chronic back pain, nephrolithiasis, and recurrent pneumonia, who presents with several months of severe fatigue and associated systemic symptoms.  Fatigue: She describes profound, persistent fatigue, often requiring daytime naps and causing emotional distress. She reports feeling "unplugged" and sometimes too tired to chew or speak. Despite this, she continues to work, though she feels she is pushing herself beyond her limits. Sleep is generally adequate but interrupted by early morning awakening (~3 AM). She denies significant daytime sleepiness or snoring.  She experiences severe fatigue, describing it as a lack of energy even for speaking, and sometimes feels so tired that she cries from exhaustion. Talking can be exhausting, and she sometimes needs to take short naps. Despite this, she sleeps well at night but often wakes up at 3 AM. She reports a decreased appetite and sometimes feels too weak to chew. Despite her fatigue, she continues to work and function daily, although she feels she is pushing her body to do so.  She has intermittent throat pain, ear pain, and jaw pain. The ear pain is described as deep inside the ear, and the jaw pain sometimes resolves on its own. She also reports  episodes of mouth blisters that resolve spontaneously. She describes hair loss and episodes of losing her voice, which then returns. She denies using toxic cleaners and mentions improvement after stopping the use of chlorine.  She experienced tingling in her left arm for two days and chest pain that resolved. She also reports joint pain that migrates, affecting her wrists, ankles, and other parts of her body, but the pain comes and goes. She feels cold frequently and has a history of low vitamin D  levels. No fevers but always feeling cold.  Five years ago, she was evaluated by a rheumatologist for Sjogren's syndrome, which was negative at that time. She also mentions a history of being bitten by ticks about eight years ago.  She is currently taking a vitamin supplement and has used an ointment for her mouth blisters, which was ineffective. No headaches currently, stating that her other symptoms are more concerning. Her fatigue is not associated with shortness of breath but is described as feeling 'unplugged'. No feeling too weak to lift her eyelids but sometimes feels too weak to chew. Associated Symptoms: Musculoskeletal: Intermittent, migratory joint pain (wrists, ankles, other joints), without persistent swelling or redness. ENT: Episodic sore throat, ear pain (deep, bilateral), jaw pain, and recurrent oral blisters that resolve spontaneously. Dermatologic: Hair loss. Neurologic: No current headache, but prior episodes; tingling in left arm for two days (now resolved); no focal deficits. Cardiovascular: Intermittent chest tightness (resolved), no current chest pain or palpitations. Other: Decreased appetite, frequent sensation of being cold, no fevers. Medical History: Prior negative workup for Sjogren's syndrome (5 years ago). History of tick bites (~  8 years ago). Recent exposure to bleach/cleaning vapors, now discontinued. No toxic cleaner use currently. Review of Systems: No shortness of  breath at rest, no significant weight loss, no night sweats, no visual changes, no new rashes. Medications: Notably nonadherent to previously prescribed Vitamin D  and several other chronic medications. Social/Family History: No tobacco, alcohol, or drug use. Family history notable for cancer, depression, and hypertension. Recent Labs and Imaging: Vitamin D : Low (25.36 ng/mL) Thyroid  panel: Normal (TSH, T4, Free Thyroxine Index) CBC, CMP: Within normal limits; no anemia. Inflammatory markers: CRP normal, ESR at upper limit (30 mm/hr). Rheumatologic labs: RF negative, CCP negative, ANA pending. Chest X-ray: Stable scarring at left lung base, no acute cardiopulmonary disease. 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 Past Medical History:  has a past medical history of Anxiety, Arthritis, Asthma, Fatty liver, History of kidney stones, Migraines, Pneumonia, and Screening breast examination (02/16/2019). Past Surgical History:   has no past surgical history on file. Social History:   reports that she has never smoked. She has never used smokeless tobacco. She reports that she does not drink alcohol and does not use drugs. Family History:  family history includes Breast cancer in her maternal aunt; Cancer in her father, maternal aunt, and paternal grandmother; Depression in her mother; Hypertension in her mother. Allergies:  is allergic to codeine, morphine and codeine, latex, and pamelor [nortriptyline hcl].   Medication Reconciliation: No current facility-administered medications on file prior to visit.   Current  Outpatient Medications on File Prior to Visit  Medication Sig   albuterol  (VENTOLIN  HFA) 108 (90 Base) MCG/ACT inhaler Inhale 2 puffs into the lungs every 6 (six) hours as needed for wheezing or shortness of breath.   ibuprofen (ADVIL) 800 MG tablet Take by mouth as needed for headache.   ipratropium-albuterol  (DUONEB) 0.5-2.5 (3) MG/3ML SOLN Take 3 mLs by nebulization every 4 (four) hours as needed.   loratadine  (CLARITIN ) 10 MG tablet Take 1 tablet (10 mg total) by mouth daily.   MELOXICAM PO Take by mouth as needed.   Vitamin D , Ergocalciferol , (DRISDOL ) 1.25 MG (50000 UNIT) CAPS capsule Take 1 capsule (50,000 Units total) by mouth every 7 (seven) days. Once WEEKLY   azithromycin  (ZITHROMAX ) 250 MG tablet Take 250 mg by mouth daily. (Patient not taking: Reported on 06/06/2023)   benzonatate  (TESSALON ) 100 MG capsule Take by mouth 3 (three) times daily as needed for cough. (Patient not taking: Reported on 06/06/2023)   Cholecalciferol 125 MCG (5000 UT) TABS Take by mouth. (Patient not taking: Reported on 06/06/2023)   famotidine (PEPCID) 40 MG tablet Take 40 mg by mouth daily. (Patient not taking: Reported on 06/06/2023)   fluticasone  (FLONASE ) 50 MCG/ACT nasal spray Place 2 sprays into both nostrils daily. Use after simply saline sinus rinse at night   fluticasone -salmeterol (ADVAIR) 250-50 MCG/ACT AEPB SMARTSIG:By Mouth (Patient not taking: Reported on 06/06/2023)   levalbuterol  (XOPENEX  HFA) 45 MCG/ACT inhaler Inhale 1 puff into the lungs every 4 (four) hours as needed for wheezing. Take as rescue inhaler for shortness of breath or wheezing. (Patient not taking: Reported on 06/06/2023)   montelukast (SINGULAIR) 10 MG tablet Take 10 mg by mouth daily. (Patient not taking: Reported on 06/06/2023)   Multiple  Vitamins-Minerals (CENTRUM MINIS WOMEN 50+) TABS Take 1 tablet by mouth daily at 6 (six) AM.   omeprazole (PRILOSEC) 40 MG capsule Take 40 mg by mouth daily. (Patient not taking: Reported on  06/06/2023)   promethazine -dextromethorphan  (PROMETHAZINE -DM) 6.25-15 MG/5ML syrup Take 5 mLs by mouth 4 (four) times daily as needed.   Respiratory Therapy Supplies (NEBULIZER/TUBING/MOUTHPIECE) KIT 1 each by Does not apply route every 4 (four) hours as needed. (Patient not taking: Reported on 10/03/2023)   Saline (SIMPLY SALINE) 0.9 % AERS Place 2 each into the nose as directed. Use nightly for sinus hygiene long-term.  Can also be used as many times daily as desired to assist with clearing congested sinuses. (Patient not taking: Reported on 06/06/2023)   traZODone (DESYREL) 50 MG tablet Take 50 mg by mouth at bedtime. (Patient not taking: Reported on 06/06/2023)   vitamin E 1000 UNIT capsule Take by mouth daily. Not sure of dosage (Patient not taking: Reported on 06/06/2023)  There are no discontinued medications.   Physical Exam:    10/05/2023   11:15 AM 10/05/2023   11:06 AM 10/05/2023   11:05 AM  Vitals with BMI  Systolic 132 152   Diastolic 84 78   Pulse 80 84 89  Vital signs reviewed.  Nursing notes reviewed. Weight trend reviewed. Physical Exam General Appearance:  No acute distress appreciable.   Well-groomed, fatigued-appearing female.  Well proportioned with no abnormal fat distribution.  Good muscle tone. Pulmonary:  Normal work of breathing at rest, no respiratory distress apparent. SpO2: 98 %  Musculoskeletal: All extremities are intact.  Neurological:  Awake, alert, oriented, and engaged.  No obvious focal neurological deficits or cognitive impairments.  Sensorium seems unclouded.   Speech is clear and coherent with logical content. Psychiatric:  Appropriate mood, pleasant and cooperative demeanor, thoughtful and engaged during the exam  No results found for any visits on 10/03/23. Hospital Outpatient Visit on 09/17/2023  Component Date Value Ref Range Status   TSH 09/17/2023 1.490  0.450 - 4.500 uIU/mL Final   T4, Total 09/17/2023 8.2  4.5 - 12.0 ug/dL Final   T3 Uptake Ratio  09/17/2023 24  24 - 39 % Final   Free Thyroxine Index 09/17/2023 2.0  1.2 - 4.9 Final   CRP 09/17/2023 0.7  <1.0 mg/dL Final   Sed Rate 92/97/7974 30  0 - 30 mm/hr Final   Rheumatoid fact SerPl-aCnc 09/17/2023 <10.0  <14.0 IU/mL Final   Folate 09/17/2023 16.8  >5.9 ng/mL Final   Vitamin B-12 09/17/2023 319  180 - 914 pg/mL Final   Vit D, 25-Hydroxy 09/17/2023 25.36 (L)  30 - 100 ng/mL Final   CCP Antibodies IgG/IgA 09/17/2023 11  0 - 19 units Final  Hospital Outpatient Visit on 09/04/2023  Component Date Value Ref Range Status   Vit D, 25-Hydroxy 09/04/2023 25.75 (L)  30 - 100 ng/mL Final   WBC 09/04/2023 6.8  4.0 - 10.5 K/uL Final   RBC 09/04/2023 4.54  3.87 - 5.11 MIL/uL Final   Hemoglobin 09/04/2023 13.6  12.0 - 15.0 g/dL Final   HCT 93/80/7974 39.9  36.0 - 46.0 % Final   MCV 09/04/2023 87.9  80.0 - 100.0 fL Final   MCH 09/04/2023 30.0  26.0 - 34.0 pg Final   MCHC 09/04/2023 34.1  30.0 - 36.0 g/dL Final   RDW 93/80/7974 12.2  11.5 - 15.5 % Final   Platelets 09/04/2023 211  150 - 400 K/uL Final   nRBC 09/04/2023 0.0  0.0 -  0.2 % Final   Neutrophils Relative % 09/04/2023 59  % Final   Neutro Abs 09/04/2023 4.0  1.7 - 7.7 K/uL Final   Lymphocytes Relative 09/04/2023 31  % Final   Lymphs Abs 09/04/2023 2.1  0.7 - 4.0 K/uL Final   Monocytes Relative 09/04/2023 7  % Final   Monocytes Absolute 09/04/2023 0.5  0.1 - 1.0 K/uL Final   Eosinophils Relative 09/04/2023 3  % Final   Eosinophils Absolute 09/04/2023 0.2  0.0 - 0.5 K/uL Final   Basophils Relative 09/04/2023 0  % Final   Basophils Absolute 09/04/2023 0.0  0.0 - 0.1 K/uL Final   Immature Granulocytes 09/04/2023 0  % Final   Abs Immature Granulocytes 09/04/2023 0.01  0.00 - 0.07 K/uL Final   TSH 09/04/2023 1.280  0.450 - 4.500 uIU/mL Final   T4, Total 09/04/2023 9.1  4.5 - 12.0 ug/dL Final   T3 Uptake Ratio 09/04/2023 28  24 - 39 % Final   Free Thyroxine Index 09/04/2023 2.5  1.2 - 4.9 Final  Office Visit on 06/06/2023   Component Date Value Ref Range Status   Amylase 06/06/2023 32  21 - 101 U/L Final   WBC 06/06/2023 9.3  3.8 - 10.8 Thousand/uL Final   RBC 06/06/2023 5.04  3.80 - 5.10 Million/uL Final   Hemoglobin 06/06/2023 14.2  11.7 - 15.5 g/dL Final   HCT 96/78/7974 43.0  35.0 - 45.0 % Final   MCV 06/06/2023 85.3  80.0 - 100.0 fL Final   MCH 06/06/2023 28.2  27.0 - 33.0 pg Final   MCHC 06/06/2023 33.0  32.0 - 36.0 g/dL Final   RDW 96/78/7974 12.4  11.0 - 15.0 % Final   Platelets 06/06/2023 231  140 - 400 Thousand/uL Final   MPV 06/06/2023 10.8  7.5 - 12.5 fL Final   Neutro Abs 06/06/2023 4,455  1,500 - 7,800 cells/uL Final   Absolute Lymphocytes 06/06/2023 4,204 (H)  850 - 3,900 cells/uL Final   Absolute Monocytes 06/06/2023 586  200 - 950 cells/uL Final   Eosinophils Absolute 06/06/2023 37  15 - 500 cells/uL Final   Basophils Absolute 06/06/2023 19  0 - 200 cells/uL Final   Neutrophils Relative % 06/06/2023 47.9  % Final   Total Lymphocyte 06/06/2023 45.2  % Final   Monocytes Relative 06/06/2023 6.3  % Final   Eosinophils Relative 06/06/2023 0.4  % Final   Basophils Relative 06/06/2023 0.2  % Final   Glucose, Bld 06/06/2023 86  65 - 99 mg/dL Final   BUN 96/78/7974 26 (H)  7 - 25 mg/dL Final   Creat 96/78/7974 0.92  0.50 - 1.03 mg/dL Final   eGFR 96/78/7974 73  > OR = 60 mL/min/1.21m2 Final   BUN/Creatinine Ratio 06/06/2023 28 (H)  6 - 22 (calc) Final   Sodium 06/06/2023 141  135 - 146 mmol/L Final   Potassium 06/06/2023 3.7  3.5 - 5.3 mmol/L Final   Chloride 06/06/2023 103  98 - 110 mmol/L Final   CO2 06/06/2023 27  20 - 32 mmol/L Final   Calcium  06/06/2023 9.4  8.6 - 10.4 mg/dL Final   Total Protein 96/78/7974 7.7  6.1 - 8.1 g/dL Final   Albumin 96/78/7974 4.4  3.6 - 5.1 g/dL Final   Globulin 96/78/7974 3.3  1.9 - 3.7 g/dL (calc) Final   AG Ratio 06/06/2023 1.3  1.0 - 2.5 (calc) Final   Total Bilirubin 06/06/2023 0.3  0.2 - 1.2 mg/dL Final   Alkaline phosphatase (APISO) 06/06/2023  60  37  - 153 U/L Final   AST 06/06/2023 16  10 - 35 U/L Final   ALT 06/06/2023 14  6 - 29 U/L Final   Hepatitis C Ab 06/06/2023 NON-REACTIVE  NON-REACTIVE Final   HIV 1&2 Ab, 4th Generation 06/06/2023 NON-REACTIVE  NON-REACTIVE Final   Lipase 06/06/2023 40  7 - 60 U/L Final  Admission on 11/12/2022, Discharged on 11/12/2022  Component Date Value Ref Range Status   Color, UA 11/12/2022 yellow  yellow Final   Clarity, UA 11/12/2022 hazy (A)  clear Final   Glucose, UA 11/12/2022 negative  negative mg/dL Final   Bilirubin, UA 91/72/7975 negative  negative Final   Ketones, POC UA 11/12/2022 negative  negative mg/dL Final   Spec Grav, UA 91/72/7975 >=1.030 (A)  1.010 - 1.025 Final   Blood, UA 11/12/2022 trace-intact (A)  negative Final   pH, UA 11/12/2022 5.5  5.0 - 8.0 Final   Protein Ur, POC 11/12/2022 negative  negative mg/dL Final   Urobilinogen, UA 11/12/2022 0.2  0.2 or 1.0 E.U./dL Final   Nitrite, UA 91/72/7975 Negative  Negative Final   Leukocytes, UA 11/12/2022 Negative  Negative Final   Specimen Description 11/12/2022    Final                   Value:URINE, CLEAN CATCH Performed at [facility] [address], [place], [place] [address]    Special Requests 11/12/2022    Final                   Value:NONE Performed at [facility], [address] [place], [place] [address]    Culture 11/12/2022    Final                   Value:NO GROWTH Performed at [facility], [address] [place], [place] [address]    Report Status 11/12/2022 11/13/2022 FINAL   Final  Office Visit on 07/03/2022  Component Date Value Ref Range Status   COLOGUARD 09/05/2022 Negative  Negative Final  No image results found. DG Chest Port 1 View Result Date: 10/05/2023 CLINICAL DATA:  Chest pain EXAM: PORTABLE CHEST 1 VIEW COMPARISON:  None Available. FINDINGS: Lungs are well expanded, symmetric, and clear save for linear scarring at the left lung base. No pneumothorax or pleural effusion. Cardiac size  within normal limits. Pulmonary vascularity is normal. Osseous structures are age-appropriate. No acute bone abnormality. IMPRESSION: No active disease. Electronically Signed   By: Dorethia Molt M.D.   On: 10/05/2023 11:29   DG Chest 2 View Result Date: 10/03/2023 CLINICAL DATA:  Fatigue.  Dyspnea on exertion. EXAM: CHEST - 2 VIEW COMPARISON:  Radiograph 06/01/2023 FINDINGS: The cardiomediastinal contours are normal. Bandlike subsegmental scarring at the left lung base, unchanged. Pulmonary vasculature is normal. No consolidation, pleural effusion, or pneumothorax. No acute osseous abnormalities are seen. IMPRESSION: No active cardiopulmonary disease. Electronically Signed   By: Andrea Gasman M.D.   On: 10/03/2023 19:57         ASSESSMENT & PLAN   Assessment & Plan Other fatigue Severe Fatigue, Body Aches, and Brain Fog Assessment: Multifactorial fatigue with associated migratory arthralgias, oral ulcers, hair loss, and intermittent chest tightness. Thyroid  dysfunction ruled out. Vitamin D  deficiency is present and may contribute, but symptoms are out of proportion to this alone. 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 Encourage adherence to multivitamin, monitor for improvement over 2-3 months. Continue expanded autoimmune/rheumatologic workup (ANA, SSA/SSB, C3/C4, CK, myasthenia panel, Lyme, celiac, IgG/IgA/IgM, ferritin, iron panel, reticulocytes, urinalysis). Monitor for new symptoms: rash, persistent joint swelling, chest pain, fever. Consider referral to rheumatology if serologies are positive or symptoms persist/worsen. Chest tightness Chest  Tightness, Dyspnea on Exertion, Left Arm Tingling (Resolved) Assessment: Symptoms may be related to prior chemical pneumonitis, underlying asthma, or less likely, cardiac or autoimmune involvement. No current active cardiopulmonary disease on imaging. No acute findings on exam. Plan: Cardiology referral for further evaluation (consider EKG, echocardiogram, stress testing). Reinforce ER precautions: return for acute chest pain, persistent left arm symptoms, syncope, or shortness of breath at rest. Continue asthma management as needed (rescue inhaler). Dyspnea on exertion Mild but Will order lab testing to guide management and get cardiologist evaluation especially due to left arm tingling. 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 Plan: Continue rheumatologic workup as above. Symptomatic relief with NSAIDs as needed (monitor for GI/renal side effects). Document any new joint swelling, redness, or persistent pain. Recurrent oral ulcers Joint Pain and Oral Ulcers Assessment: Migratory joint pain and oral ulcers raise suspicion for systemic autoimmune disease (e.g., SLE, Sjogren's, other CTD). Plan: Continue rheumatologic workup as above. Symptomatic relief with NSAIDs as needed (monitor for GI/renal side effects). Document any new joint swelling, redness, or persistent pain. Vitamin deficiency Assessment: Contributes to fatigue, muscle aches, and possibly mood changes. Plan: Resume high-dose Vitamin D  as above. Recheck Vitamin D  level in 3 months. Counsel on dietary sources and sun exposure. Other Active Problems Continue management of chronic asthma, sinusitis, GERD, and other comorbidities as previously outlined. Encourage avoidance of chemical irritants and  allergens. Follow-Up: Return to clinic in 4 weeks or sooner if symptoms worsen. Review all pending labs and imaging at next visit. Patient instructed to call or return for any new or severe symptoms (especially chest pain, syncope, or focal neurologic deficits). Patient Education: Discussed possible causes of fatigue, importance of medication adherence, and red flag symptoms. Patient actively participated in documentation and plan review.  Orders Placed: Comprehensive autoimmune, infectious, and metabolic workup as listed above. Cardiology referral. Chest imaging (completed).  Summary: This is a complex case of chronic, severe fatigue with systemic features. Initial workup has ruled out hypothyroidism and acute cardiopulmonary disease. Vitamin D  deficiency is being addressed, but further evaluation for autoimmune/connective tissue disease and cardiac causes is ongoing. Close follow-up and multidisciplinary care are warranted.    ORDER ASSOCIATIONS  #   DIAGNOSIS / CONDITION ICD-10 ENCOUNTER ORDER     ICD-10-CM   1. Other fatigue  R53.83 Uric acid    DG Chest 2 View    CYCLIC CITRUL PEPTIDE ANTIBODY, IGG/IGA    ANA w/Reflex if Positive    Sjogren's syndrome antibods(ssa + ssb)    Myasthenia gravis panel 2    Lyme Disease Serology w/Reflex    Ambulatory referral to Cardiology    C3 complement    C4 complement    Toxoplasma antibodies- IgG and  IgM    Sedimentation rate    C-reactive protein    CK (Creatine Kinase)    Iron, TIBC and Ferritin Panel    Urinalysis w microscopic + reflex cultur  Celiac Disease Comprehensive Panel with Reflexes    Reticulocytes    IgG, IgA, IgM    2. Chest tightness  R07.89 DG Chest 2 View    Lipid panel    Ambulatory referral to Cardiology    C3 complement    C4 complement    Toxoplasma antibodies- IgG and  IgM    Sedimentation rate    C-reactive protein    CK (Creatine Kinase)    Iron, TIBC and Ferritin Panel    Urinalysis w  microscopic + reflex cultur    Celiac Disease Comprehensive Panel with Reflexes    Reticulocytes    IgG, IgA, IgM    3. Dyspnea on exertion  R06.09 Ambulatory referral to Cardiology    C3 complement    C4 complement    Toxoplasma antibodies- IgG and  IgM    Sedimentation rate    C-reactive protein    CK (Creatine Kinase)    Iron, TIBC and Ferritin Panel    Urinalysis w microscopic + reflex cultur    Celiac Disease Comprehensive Panel with Reflexes    Reticulocytes    IgG, IgA, IgM    4. 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 Arthralgia, unspecified joint  M25.50     6. Recurrent oral ulcers  K13.79     7. Vitamin deficiency  E56.9        This document was synthesized by artificial intelligence ([employer]) using HIPAA-compliant recording of the clinical interaction;   We discussed the use of AI scribe software for clinical note transcription with the patient, who gave verbal consent to proceed. additional Info: This encounter employed state-of-the-art, real-time, collaborative documentation. The patient actively reviewed and assisted in updating their electronic medical record on a shared screen, ensuring transparency and facilitating joint problem-solving for the problem list, overview, and plan. This approach promotes accurate, informed care. The treatment plan was discussed and reviewed in detail, including medication safety, potential side effects, and all patient questions. We confirmed understanding and comfort with the plan. Follow-up instructions were established, including contacting the office for any concerns, returning if symptoms worsen, persist, or new symptoms develop, and precautions for potential emergency department visits.

## 2023-10-04 ENCOUNTER — Ambulatory Visit: Payer: Self-pay | Admitting: Internal Medicine

## 2023-10-05 ENCOUNTER — Other Ambulatory Visit: Payer: Self-pay

## 2023-10-05 ENCOUNTER — Emergency Department (HOSPITAL_COMMUNITY)

## 2023-10-05 ENCOUNTER — Encounter (HOSPITAL_COMMUNITY): Payer: Self-pay | Admitting: *Deleted

## 2023-10-05 ENCOUNTER — Emergency Department (HOSPITAL_COMMUNITY)
Admission: EM | Admit: 2023-10-05 | Discharge: 2023-10-05 | Disposition: A | Discharge status: Home / Self Care | Attending: Emergency Medicine | Admitting: Emergency Medicine

## 2023-10-05 DIAGNOSIS — J45909 Unspecified asthma, uncomplicated: Secondary | ICD-10-CM | POA: Insufficient documentation

## 2023-10-05 DIAGNOSIS — R079 Chest pain, unspecified: Secondary | ICD-10-CM

## 2023-10-05 DIAGNOSIS — Z91040 Latex allergy status: Secondary | ICD-10-CM | POA: Insufficient documentation

## 2023-10-05 DIAGNOSIS — R0789 Other chest pain: Secondary | ICD-10-CM | POA: Insufficient documentation

## 2023-10-05 LAB — COMPREHENSIVE METABOLIC PANEL WITH GFR
ALT: 23 U/L (ref 0–44)
AST: 29 U/L (ref 15–41)
Albumin: 4.6 g/dL (ref 3.5–5.0)
Alkaline Phosphatase: 70 U/L (ref 38–126)
Anion gap: 15 (ref 5–15)
BUN: 19 mg/dL (ref 6–20)
CO2: 22 mmol/L (ref 22–32)
Calcium: 9.9 mg/dL (ref 8.9–10.3)
Chloride: 102 mmol/L (ref 98–111)
Creatinine, Ser: 0.8 mg/dL (ref 0.44–1.00)
GFR, Estimated: >60 mL/min (ref >60)
Glucose, Bld: 98 mg/dL (ref 70–99)
Potassium: 3.4 mmol/L — ABNORMAL LOW (ref 3.5–5.1)
Sodium: 139 mmol/L (ref 135–145)
Total Bilirubin: 0.6 mg/dL (ref 0.0–1.2)
Total Protein: 8.2 g/dL — ABNORMAL HIGH (ref 6.5–8.1)

## 2023-10-05 LAB — TROPONIN I (HIGH SENSITIVITY)
Troponin I (High Sensitivity): 3 ng/L (ref <18)
Troponin I (High Sensitivity): 3 ng/L (ref <18)

## 2023-10-05 LAB — CBC WITH DIFFERENTIAL/PLATELET
Abs Immature Granulocytes: 0.02 K/uL (ref 0.00–0.07)
Basophils Absolute: 0 K/uL (ref 0.0–0.1)
Basophils Relative: 1 %
Eosinophils Absolute: 0.2 K/uL (ref 0.0–0.5)
Eosinophils Relative: 2 %
HCT: 43.1 % (ref 36.0–46.0)
Hemoglobin: 14.4 g/dL (ref 12.0–15.0)
Immature Granulocytes: 0 %
Lymphocytes Relative: 48 %
Lymphs Abs: 3.3 K/uL (ref 0.7–4.0)
MCH: 29 pg (ref 26.0–34.0)
MCHC: 33.4 g/dL (ref 30.0–36.0)
MCV: 86.9 fL (ref 80.0–100.0)
Monocytes Absolute: 0.5 K/uL (ref 0.1–1.0)
Monocytes Relative: 7 %
Neutro Abs: 2.9 K/uL (ref 1.7–7.7)
Neutrophils Relative %: 42 %
Platelets: 260 K/uL (ref 150–400)
RBC: 4.96 MIL/uL (ref 3.87–5.11)
RDW: 11.9 % (ref 11.5–15.5)
WBC: 6.9 K/uL (ref 4.0–10.5)
nRBC: 0 % (ref 0.0–0.2)

## 2023-10-05 LAB — CBG MONITORING, ED: Glucose-Capillary: 94 mg/dL (ref 70–99)

## 2023-10-05 LAB — MAGNESIUM: Magnesium: 2.1 mg/dL (ref 1.7–2.4)

## 2023-10-05 LAB — D-DIMER, QUANTITATIVE: D-Dimer, Quant: 0.36 ug{FEU}/mL (ref 0.00–0.50)

## 2023-10-05 MED ORDER — LORAZEPAM 1 MG PO TABS
1.0000 mg | ORAL_TABLET | Freq: Once | ORAL | Status: AC
  Administered 2023-10-05: 1 mg via ORAL
  Filled 2023-10-05: qty 1

## 2023-10-05 MED ORDER — ONDANSETRON HCL 4 MG/2ML IJ SOLN
4.0000 mg | Freq: Once | INTRAMUSCULAR | Status: AC
  Administered 2023-10-05: 4 mg via INTRAVENOUS

## 2023-10-05 MED ORDER — ASPIRIN 81 MG PO CHEW
324.0000 mg | CHEWABLE_TABLET | Freq: Once | ORAL | Status: AC
  Administered 2023-10-05: 324 mg via ORAL
  Filled 2023-10-05: qty 4

## 2023-10-05 MED ORDER — ONDANSETRON HCL 4 MG/2ML IJ SOLN
INTRAMUSCULAR | Status: AC
  Filled 2023-10-05: qty 2

## 2023-10-05 NOTE — Assessment & Plan Note (Signed)
 Chest Tightness, Dyspnea on Exertion, Left Arm Tingling (Resolved) Assessment: Symptoms may be related to prior chemical pneumonitis, underlying asthma, or less likely, cardiac or autoimmune involvement. No current active cardiopulmonary disease on imaging. No acute findings on exam. Plan: Cardiology referral for further evaluation (consider EKG, echocardiogram, stress testing). Reinforce ER precautions: return for acute chest pain, persistent left arm symptoms, syncope, or shortness of breath at rest. Continue asthma management as needed (rescue inhaler).

## 2023-10-05 NOTE — ED Notes (Signed)
 Ambulatory to bathroom.

## 2023-10-05 NOTE — Patient Instructions (Signed)
 VISIT SUMMARY:  During your visit, we discussed your severe fatigue and multiple systemic symptoms, including joint pain, mouth blisters, and hair loss. We also reviewed your history of chest tightness and left arm tingling, as well as your vitamin D  deficiency. We have planned several tests and referrals to better understand and address your symptoms.  YOUR PLAN:  -SEVERE FATIGUE: Severe fatigue means you are experiencing extreme tiredness that is not relieved by rest and is affecting your daily activities. We suspect an underlying autoimmune condition and will conduct blood tests for autoimmune markers, including rheumatoid arthritis, Sjogren's syndrome, lupus, myasthenia gravis, and Lyme disease. We will also check for sleep apnea and cardiovascular issues. You will be referred to a cardiologist to evaluate any potential heart-related causes.  -JOINT PAIN: Joint pain refers to discomfort in your joints, which in your case is intermittent and migratory, affecting your wrists and ankles. This may be part of the suspected autoimmune process, and we will include this in the autoimmune workup.  -MOUTH BLISTERS: Mouth blisters are sores in your mouth that coincide with your severe fatigue and other symptoms, suggesting a possible autoimmune disorder. This will be part of the overall evaluation.  -CHEST TIGHTNESS AND LEFT ARM TINGLING: Chest tightness and left arm tingling are concerning symptoms that could indicate a heart problem. You will be referred to a cardiologist for further evaluation. If these symptoms recur before your cardiology appointment, please go to the emergency room immediately.  -VITAMIN D  DEFICIENCY: Vitamin D  deficiency means you have lower than normal levels of vitamin D , which can contribute to fatigue. We will address this as part of your overall treatment plan.  INSTRUCTIONS:  Please complete the blood tests for autoimmune markers and Lyme disease as soon as possible. Schedule  an appointment with a cardiologist for further evaluation of your chest tightness and left arm tingling. If you experience these symptoms again before your cardiology appointment, go to the emergency room immediately. Continue taking your vitamin D  supplement as directed.

## 2023-10-05 NOTE — Discharge Instructions (Addendum)
 Thankfully all of your test have been very normal, there is no signs of heart attack at all.  I would like for you to follow-up with your family doctor this week but I want you to return to the ER for severe worsening symptoms.  I have also given you a referral to the cardiology clinic here in town.  Please call the number to make a follow-up to be seen.  Even though you are otherwise very healthy it is not unusual for people to start to develop heart disease over time and just in case I would like for you to see a cardiologist.  It would be best to follow-up with your family doctor to make sure that you are going over all of your results from the clinic, they may need to start you on something for anxiety if these episodes continue to happen

## 2023-10-05 NOTE — ED Triage Notes (Signed)
 Pt with chest tightness and left arm weakness started 25 minutes ago.  Hx of same a week ago and did not get seen.  + fatigue + shaking  denies fevers but c/o being cold,  pt feels like this is a panic attack

## 2023-10-05 NOTE — ED Provider Notes (Signed)
 [place] EMERGENCY DEPARTMENT AT [facility] Provider Note   CSN: [id number] Arrival date & time: 10/05/23  1052     Patient presents with: Extremity Weakness   Kathryn Salas is a 58 y.o. female.    Extremity Weakness  This is a 58 year old female with a history of asthma, she has an albuterol  inhaler that she hardly ever uses.  She denies any other significant chronic medical problems, she presents today with a complaint of a chest discomfort with left arm heaviness, she has an associated feeling of jitteriness shortness of breath and anxiety, she feels like she is shaking in both of her arms.  She had a normal morning when she woke up this morning, when she got to church she had acute onset of the symptoms and felt very lightheaded, she appeared pale and had to sit down in the bathroom.  This was witnessed by family member who is at the bedside as an additional historian.  She still has symptoms now, she denies diaphoresis but does feel like her stomach is upset and causing nausea.  She denies swelling of the legs and does not smoke or drink.  She has never had cardiac disease.  She did have a similar episode about a week ago which she ignored and went away spontaneously.  She denies any significant life stressors     Prior to Admission medications   Medication Sig Start Date End Date Taking? Authorizing Provider  albuterol  (VENTOLIN  HFA) 108 (90 Base) MCG/ACT inhaler Inhale 2 puffs into the lungs every 6 (six) hours as needed for wheezing or shortness of breath.    [provider]  azithromycin  (ZITHROMAX ) 250 MG tablet Take 250 mg by mouth daily. Patient not taking: Reported on 06/06/2023    [provider]  benzonatate  (TESSALON ) 100 MG capsule Take by mouth 3 (three) times daily as needed for cough. Patient not taking: Reported on 06/06/2023    [provider]  Cholecalciferol 125 MCG (5000 UT) TABS Take by mouth. Patient not taking: Reported on  06/06/2023    [provider]  famotidine (PEPCID) 40 MG tablet Take 40 mg by mouth daily. Patient not taking: Reported on 06/06/2023    [provider]  fluticasone  (FLONASE ) 50 MCG/ACT nasal spray Place 2 sprays into both nostrils daily. Use after simply saline sinus rinse at night 07/03/22   Jesus Bernardino MATSU, MD  fluticasone -salmeterol (ADVAIR) 250-50 MCG/ACT AEPB SMARTSIG:By Mouth Patient not taking: Reported on 06/06/2023 06/13/22   [provider]  ibuprofen (ADVIL) 800 MG tablet Take by mouth as needed for headache. 12/28/19   [provider]  ipratropium-albuterol  (DUONEB) 0.5-2.5 (3) MG/3ML SOLN Take 3 mLs by nebulization every 4 (four) hours as needed. 06/06/23   Jesus Bernardino MATSU, MD  levalbuterol  (XOPENEX  HFA) 45 MCG/ACT inhaler Inhale 1 puff into the lungs every 4 (four) hours as needed for wheezing. Take as rescue inhaler for shortness of breath or wheezing. Patient not taking: Reported on 06/06/2023 07/03/22   Jesus Bernardino MATSU, MD  loratadine  (CLARITIN ) 10 MG tablet Take 1 tablet (10 mg total) by mouth daily. 07/03/22   Jesus Bernardino MATSU, MD  MELOXICAM PO Take by mouth as needed.    [provider]  montelukast (SINGULAIR) 10 MG tablet Take 10 mg by mouth daily. Patient not taking: Reported on 06/06/2023 06/13/22   [provider]  Multiple Vitamins-Minerals (CENTRUM MINIS WOMEN 50+) TABS Take 1 tablet by mouth daily at 6 (six) AM. 09/19/23  Jesus Bernardino MATSU, MD  omeprazole (PRILOSEC) 40 MG capsule Take 40 mg by mouth daily. Patient not taking: Reported on 06/06/2023    [provider]  promethazine -dextromethorphan  (PROMETHAZINE -DM) 6.25-15 MG/5ML syrup Take 5 mLs by mouth 4 (four) times daily as needed. 06/01/23   Leath-Warren, Etta PARAS, NP  Respiratory Therapy Supplies (NEBULIZER/TUBING/MOUTHPIECE) KIT 1 each by Does not apply route every 4 (four) hours as needed. Patient not taking: Reported on 10/03/2023 06/06/23   Jesus Bernardino MATSU, MD  Saline (SIMPLY SALINE) 0.9 % AERS Place 2 each into the nose as directed. Use nightly for sinus hygiene long-term.  Can also be used as many times daily as desired to assist with clearing congested sinuses. Patient not taking: Reported on 06/06/2023 07/03/22   Jesus Bernardino MATSU, MD  traZODone (DESYREL) 50 MG tablet Take 50 mg by mouth at bedtime. Patient not taking: Reported on 06/06/2023 06/16/22   [provider]  Vitamin D , Ergocalciferol , (DRISDOL ) 1.25 MG (50000 UNIT) CAPS capsule Take 1 capsule (50,000 Units total) by mouth every 7 (seven) days. Once WEEKLY 09/19/23   Jesus Bernardino MATSU, MD  vitamin E 1000 UNIT capsule Take by mouth daily. Not sure of dosage Patient not taking: Reported on 06/06/2023    [provider]    Allergies: Codeine, Morphine and codeine, Latex, and Pamelor [nortriptyline hcl]    Review of Systems  Musculoskeletal:  Positive for extremity weakness.  All other systems reviewed and are negative.   Updated Vital Signs BP 110/67   Pulse 62   Temp (!) 97.5 F (36.4 C) (Oral)   Resp 14   Ht 1.626 m (5' 4)   Wt 67.6 kg   LMP 03/19/2003 (Approximate)   SpO2 98%   BMI 25.58 kg/m   Physical Exam Vitals and nursing note reviewed.  Constitutional:      General: She is not in acute distress.    Appearance: She is well-developed.  HENT:     Head: Normocephalic and atraumatic.     Nose: Nose normal. No congestion or rhinorrhea.     Mouth/Throat:     Mouth: Mucous membranes are moist.     Pharynx: No oropharyngeal exudate.  Eyes:     General: No scleral icterus.       Right eye: No discharge.        Left eye: No discharge.     Conjunctiva/sclera: Conjunctivae normal.     Pupils: Pupils are equal, round, and reactive to light.  Neck:     Thyroid : No thyromegaly.     Vascular: No JVD.  Cardiovascular:     Rate and Rhythm: Normal rate and regular rhythm.     Heart sounds: Normal heart sounds. No murmur heard.    No friction rub. No  gallop.  Pulmonary:     Effort: Pulmonary effort is normal. No respiratory distress.     Breath sounds: Normal breath sounds. No wheezing or rales.  Abdominal:     General: Bowel sounds are normal. There is no distension.     Palpations: Abdomen is soft. There is no mass.     Tenderness: There is no abdominal tenderness.  Musculoskeletal:        General: No tenderness. Normal range of motion.     Cervical back: Normal range of motion and neck supple.     Right lower leg: No edema.     Left lower leg: No edema.  Lymphadenopathy:     Cervical: No cervical adenopathy.  Skin:    General: Skin is warm and dry.     Findings: No erythema or rash.  Neurological:     General: No focal deficit present.     Mental Status: She is alert.     Coordination: Coordination normal.     Comments: Normal facial symmetry, cranial nerves III through XII are normal, coordination is normal by finger-nose-finger and rapid alternating movements.  Totally normal strength and sensation of all 4 extremities tested at the major muscle groups and grips.  Speech is clear per daughter  Psychiatric:     Comments: Very anxious appearing, has tremor of bilateral upper and lower extremities     (all labs ordered are listed, but only abnormal results are displayed) Labs Reviewed  COMPREHENSIVE METABOLIC PANEL WITH GFR - Abnormal; Notable for the following components:      Result Value   Potassium 3.4 (*)    Total Protein 8.2 (*)    All other components within normal limits  MAGNESIUM  CBC WITH DIFFERENTIAL/PLATELET  D-DIMER, QUANTITATIVE  CBG MONITORING, ED  TROPONIN I (HIGH SENSITIVITY)  TROPONIN I (HIGH SENSITIVITY)    EKG: EKG Interpretation Date/Time:  Sunday October 05 2023 11:50:28 EDT Ventricular Rate:  69 PR Interval:  150 QRS Duration:  98 QT Interval:  397 QTC Calculation: 426 R Axis:   41  Text Interpretation: Sinus rhythm Baseline wander in lead(s) V2 Confirmed by Cleotilde Rogue ([id number]) on  10/05/2023 11:57:40 AM  Radiology: [facility] Chest Port 1 View Result Date: 10/05/2023 CLINICAL DATA:  Chest pain EXAM: PORTABLE CHEST 1 VIEW COMPARISON:  None Available. FINDINGS: Lungs are well expanded, symmetric, and clear save for linear scarring at the left lung base. No pneumothorax or pleural effusion. Cardiac size within normal limits. Pulmonary vascularity is normal. Osseous structures are age-appropriate. No acute bone abnormality. IMPRESSION: No active disease. Electronically Signed   By: Dorethia Molt M.D.   On: 10/05/2023 11:29   DG Chest 2 View Result Date: 10/03/2023 CLINICAL DATA:  Fatigue.  Dyspnea on exertion. EXAM: CHEST - 2 VIEW COMPARISON:  Radiograph 06/01/2023 FINDINGS: The cardiomediastinal contours are normal. Bandlike subsegmental scarring at the left lung base, unchanged. Pulmonary vasculature is normal. No consolidation, pleural effusion, or pneumothorax. No acute osseous abnormalities are seen. IMPRESSION: No active cardiopulmonary disease. Electronically Signed   By: Andrea Gasman M.D.   On: 10/03/2023 19:57     Procedures   Medications Ordered in the ED  ondansetron  (ZOFRAN ) 4 MG/2ML injection (has no administration in time range)  aspirin  chewable tablet 324 mg (324 mg Oral Given 10/05/23 1131)  LORazepam  (ATIVAN ) tablet 1 mg (1 mg Oral Given 10/05/23 1132)  ondansetron  (ZOFRAN ) injection 4 mg (4 mg Intravenous Given 10/05/23 1131)                                    Medical Decision Making Amount and/or Complexity of Data Reviewed Labs: ordered. Radiology: ordered. ECG/medicine tests: ordered.  Risk OTC drugs. Prescription drug management.    This patient presents to the ED for concern of chest discomfort with left arm symptoms, associated tremor in all 4 extremities and shortness of breath, this involves an extensive number of treatment options, and is a complaint that carries with it a high risk of complications and morbidity.  The differential diagnosis  includes Zaidi attack, panic attack, cardiac disease, seems less likely to be stroke, she is not tachycardic or  hypoxic, pulmonary embolism is on the list but down the list.   Co morbidities / Chronic conditions that complicate the patient evaluation  No significant chronic medical issues other than asthma   Additional history obtained:  Additional history obtained from EMR External records from outside source obtained and reviewed including medical record, the patient has been seen for severe fatigue 2 days ago, is in the process of being worked up by her primary care doctor, had significant lab workup that was initiated including an x-ray done 2 days ago, that x-ray showed no acute disease Folate level was normal, vitamin D  level was low, B12 was normal, rheumatoid factor was undetectably low, sed rate and CRP were both in a normal range, TSH was normal.  Multiple labs have been ordered and not yet drawn  Lab Tests:  I Ordered, and personally interpreted labs.  The pertinent results include: 2 negative troponins, CBC and metabolic panel unremarkable, magnesium normal, CBC without anemia, D-dimer normal, glucose normal   Imaging Studies ordered:  I ordered imaging studies including chest x-ray I independently visualized and interpreted imaging which showed no acute findings I agree with the radiologist interpretation   Cardiac Monitoring: / EKG:  The patient was maintained on a cardiac monitor.  I personally viewed and interpreted the cardiac monitored which showed an underlying rhythm of: Normal sinus rhythm, no tachycardia or arrhythmias seen   Problem List / ED Course / Critical interventions / Medication management  The patient is extremely well-appearing, in fact after she was given a single dose of oral benzodiazepine she almost immediately felt better and has stayed calm and rested the entire time. I ordered medication including lorazepam  Reevaluation of the patient after  these medicines showed that the patient improved significantly I have reviewed the patients home medicines and have made adjustments as needed   Social Determinants of Health:  None   Test / Admission - Considered:  Stated admission but with 2 negative troponins and a low heart score the patient can safely be discharged home, I discussed this with the patient and family at the bedside and they are agreeable.      Final diagnoses:  Chest pain, unspecified type    ED Discharge Orders     None          Cleotilde Rogue, MD 10/05/23 [phone]

## 2023-10-06 ENCOUNTER — Other Ambulatory Visit: Payer: Self-pay

## 2023-10-06 ENCOUNTER — Other Ambulatory Visit (HOSPITAL_COMMUNITY)
Admission: RE | Admit: 2023-10-06 | Discharge: 2023-10-06 | Disposition: A | Discharge status: Home / Self Care | Payer: Self-pay | Source: Ambulatory Visit | Attending: Internal Medicine | Admitting: Internal Medicine

## 2023-10-06 ENCOUNTER — Other Ambulatory Visit

## 2023-10-06 DIAGNOSIS — B589 Toxoplasmosis, unspecified: Secondary | ICD-10-CM

## 2023-10-06 DIAGNOSIS — R5383 Other fatigue: Secondary | ICD-10-CM

## 2023-10-06 DIAGNOSIS — R52 Pain, unspecified: Secondary | ICD-10-CM

## 2023-10-06 DIAGNOSIS — E860 Dehydration: Secondary | ICD-10-CM

## 2023-10-06 DIAGNOSIS — R682 Dry mouth, unspecified: Secondary | ICD-10-CM

## 2023-10-06 DIAGNOSIS — R0609 Other forms of dyspnea: Secondary | ICD-10-CM

## 2023-10-06 DIAGNOSIS — R1013 Epigastric pain: Secondary | ICD-10-CM

## 2023-10-06 DIAGNOSIS — M2550 Pain in unspecified joint: Secondary | ICD-10-CM

## 2023-10-06 DIAGNOSIS — R4189 Other symptoms and signs involving cognitive functions and awareness: Secondary | ICD-10-CM

## 2023-10-06 DIAGNOSIS — E559 Vitamin D deficiency, unspecified: Secondary | ICD-10-CM

## 2023-10-06 LAB — LIPID PANEL
Cholesterol: 220 mg/dL — ABNORMAL HIGH (ref 0–200)
HDL: 66 mg/dL (ref >40)
LDL Cholesterol: 142 mg/dL — ABNORMAL HIGH (ref 0–99)
Total CHOL/HDL Ratio: 3.3 ratio
Triglycerides: 61 mg/dL (ref <150)
VLDL: 12 mg/dL (ref 0–40)

## 2023-10-06 LAB — RETICULOCYTES
Immature Retic Fract: 7.2 % (ref 2.3–15.9)
RBC.: 5.01 MIL/uL (ref 3.87–5.11)
Retic Count, Absolute: 39.6 K/uL (ref 19.0–186.0)
Retic Ct Pct: 0.8 % (ref 0.4–3.1)

## 2023-10-06 LAB — URINALYSIS, W/ REFLEX TO CULTURE (INFECTION SUSPECTED)
Bacteria, UA: NONE SEEN
Bilirubin Urine: NEGATIVE
Glucose, UA: NEGATIVE mg/dL
Hgb urine dipstick: NEGATIVE
Ketones, ur: NEGATIVE mg/dL
Leukocytes,Ua: NEGATIVE
Nitrite: NEGATIVE
Protein, ur: NEGATIVE mg/dL
Specific Gravity, Urine: 1.02 (ref 1.005–1.030)
pH: 5 (ref 5.0–8.0)

## 2023-10-06 LAB — URIC ACID: Uric Acid, Serum: 5.8 mg/dL (ref 2.5–7.1)

## 2023-10-06 LAB — IRON AND TIBC
Iron: 75 ug/dL (ref 28–170)
Saturation Ratios: 19 % (ref 10.4–31.8)
TIBC: 391 ug/dL (ref 250–450)
UIBC: 316 ug/dL

## 2023-10-06 LAB — SEDIMENTATION RATE: Sed Rate: 28 mm/h (ref 0–30)

## 2023-10-06 LAB — C-REACTIVE PROTEIN: CRP: 0.6 mg/dL (ref <1.0)

## 2023-10-06 LAB — CK: Total CK: 111 U/L (ref 38–234)

## 2023-10-06 LAB — FERRITIN: Ferritin: 60 ng/mL (ref 11–307)

## 2023-10-06 NOTE — [facility] (Signed)
 Assisted pt with scheduling appointment with [facility]

## 2023-10-07 ENCOUNTER — Encounter: Payer: Self-pay | Admitting: Internal Medicine

## 2023-10-07 ENCOUNTER — Ambulatory Visit: Payer: Self-pay | Admitting: Internal Medicine

## 2023-10-07 DIAGNOSIS — E559 Vitamin D deficiency, unspecified: Secondary | ICD-10-CM | POA: Insufficient documentation

## 2023-10-07 DIAGNOSIS — Z8619 Personal history of other infectious and parasitic diseases: Secondary | ICD-10-CM | POA: Insufficient documentation

## 2023-10-07 DIAGNOSIS — R0789 Other chest pain: Secondary | ICD-10-CM

## 2023-10-07 DIAGNOSIS — E785 Hyperlipidemia, unspecified: Secondary | ICD-10-CM | POA: Insufficient documentation

## 2023-10-07 DIAGNOSIS — R5383 Other fatigue: Secondary | ICD-10-CM | POA: Insufficient documentation

## 2023-10-07 DIAGNOSIS — R5382 Chronic fatigue, unspecified: Secondary | ICD-10-CM | POA: Insufficient documentation

## 2023-10-07 LAB — C3 COMPLEMENT: C3 Complement: 186 mg/dL — ABNORMAL HIGH (ref 82–167)

## 2023-10-07 LAB — SJOGRENS SYNDROME-A EXTRACTABLE NUCLEAR ANTIBODY: SSA (Ro) (ENA) Antibody, IgG: 0.2 AI (ref 0.0–0.9)

## 2023-10-07 LAB — C4 COMPLEMENT: Complement C4, Body Fluid: 46 mg/dL — ABNORMAL HIGH (ref 12–38)

## 2023-10-07 LAB — IGG, IGA, IGM
IgA: 167 mg/dL (ref 87–352)
IgG (Immunoglobin G), Serum: 1183 mg/dL (ref 586–1602)
IgM (Immunoglobulin M), Srm: 151 mg/dL (ref 26–217)

## 2023-10-07 LAB — TOXOPLASMA ANTIBODIES- IGG AND  IGM
Toxoplasma Antibody- IgM: <3 [AU]/ml (ref 0.0–7.9)
Toxoplasma IgG Ratio: 256 [IU]/mL — ABNORMAL HIGH (ref 0.0–7.1)

## 2023-10-07 LAB — ANA W/REFLEX IF POSITIVE: Anti Nuclear Antibody (ANA): NEGATIVE

## 2023-10-07 LAB — SJOGRENS SYNDROME-B EXTRACTABLE NUCLEAR ANTIBODY: SSB (La) (ENA) Antibody, IgG: <0.2 AI (ref 0.0–0.9)

## 2023-10-07 LAB — ACETYLCHOLINE RECEPTOR, BINDING: Acety choline binding ab: <0.07 nmol/L (ref 0.00–0.24)

## 2023-10-07 LAB — CYCLIC CITRUL PEPTIDE ANTIBODY, IGG/IGA: CCP Antibodies IgG/IgA: 4 U (ref 0–19)

## 2023-10-07 LAB — LYME DISEASE SEROLOGY W/REFLEX: Lyme Total Antibody EIA: NEGATIVE

## 2023-10-07 MED ORDER — ROSUVASTATIN CALCIUM 10 MG PO TABS: 10.0000 mg | ORAL_TABLET | Freq: Every day | ORAL | 1 refills | Status: DC

## 2023-10-08 NOTE — Telephone Encounter (Signed)
 Provider remarks and recommendations viewed by patient via MyChart.   Last read by Brent Ambrosia at 8:34PM on 10/07/2023.

## 2023-10-22 ENCOUNTER — Encounter: Payer: Self-pay | Admitting: Internal Medicine

## 2023-10-22 ENCOUNTER — Ambulatory Visit (INDEPENDENT_AMBULATORY_CARE_PROVIDER_SITE_OTHER): Admitting: Internal Medicine

## 2023-10-22 VITALS — BP 120/76 | HR 67 | Temp 97.3°F | Ht 64.0 in | Wt 149.2 lb

## 2023-10-22 DIAGNOSIS — E785 Hyperlipidemia, unspecified: Secondary | ICD-10-CM

## 2023-10-22 DIAGNOSIS — Z758 Other problems related to medical facilities and other health care: Secondary | ICD-10-CM

## 2023-10-22 DIAGNOSIS — F411 Generalized anxiety disorder: Secondary | ICD-10-CM

## 2023-10-22 DIAGNOSIS — F410 Panic disorder [episodic paroxysmal anxiety] without agoraphobia: Secondary | ICD-10-CM

## 2023-10-22 DIAGNOSIS — Z603 Acculturation difficulty: Secondary | ICD-10-CM

## 2023-10-22 DIAGNOSIS — K9041 Non-celiac gluten sensitivity: Secondary | ICD-10-CM

## 2023-10-22 DIAGNOSIS — M791 Myalgia, unspecified site: Secondary | ICD-10-CM

## 2023-10-22 DIAGNOSIS — R202 Paresthesia of skin: Secondary | ICD-10-CM

## 2023-10-22 DIAGNOSIS — F422 Mixed obsessional thoughts and acts: Secondary | ICD-10-CM

## 2023-10-22 DIAGNOSIS — T466X5A Adverse effect of antihyperlipidemic and antiarteriosclerotic drugs, initial encounter: Secondary | ICD-10-CM

## 2023-10-22 DIAGNOSIS — L659 Nonscarring hair loss, unspecified: Secondary | ICD-10-CM

## 2023-10-22 MED ORDER — LORAZEPAM 1 MG PO TABS: 1.0000 mg | ORAL_TABLET | Freq: Two times a day (BID) | ORAL | 0 refills | Status: DC | PRN

## 2023-10-22 MED ORDER — SERTRALINE HCL 25 MG PO TABS: 25.0000 mg | ORAL_TABLET | Freq: Every day | ORAL | 3 refills | Status: DC

## 2023-10-22 NOTE — Assessment & Plan Note (Signed)
 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 Lorazepam  was effective during an ER visit, but due to its addictive nature, alternative management is necessary. Anxiety may cause other symptoms, so managing it is crucial to prevent recurrence of panic attacks. Further evaluation for neurotransmitter imbalance due to mental health issues or rare tumors is planned. Prescribe Zoloft  for anxiety and OCD features. Provide lorazepam  for emergency use during panic attacks. Order a 24-hour urine test to evaluate neurotransmitter levels. Recommend follow-up in one month to adjust medications and review progress.  Paresthesia (deep tingling sensation) under evaluation   Described as a deep tingling, nervous sensation throughout the body, intermittent and associated with anxiety. Further evaluation is needed to determine if related to neurotransmitter imbalance or other causes. Order a 24-hour urine test to evaluate neurotransmitter levels.

## 2023-10-22 NOTE — Assessment & Plan Note (Signed)
 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 Discontinue cholesterol medication for one week to assess impact on leg pain.  Currently managed with statin therapy. Plan to discontinue statin temporarily to assess if it is the cause of leg pain. Discontinue statin therapy for one week to assess impact on leg pain.

## 2023-10-22 NOTE — Assessment & Plan Note (Signed)
 Hair loss and fatigue under evaluation for secondary causes  - most likely diagnosis gluten intolerance Thyroid  dysfunction has been ruled out. Symptoms are not fully explained by anxiety. A gluten-free diet improved other symptoms but not hair loss. Further evaluation is needed to determine secondary causes

## 2023-10-22 NOTE — Assessment & Plan Note (Signed)
 Daughter is outstanding interpreter and attended today's visit Used spanish text also to support patient understanding

## 2023-10-22 NOTE — Assessment & Plan Note (Signed)
 Constipation, improved with dietary modification   Symptoms improved with increased intake of fruits and vegetables and reduced gluten consumption.

## 2023-10-22 NOTE — Progress Notes (Signed)
 ==============================  [facility]: [phone]   -- Medical Office Visit --  Patient: Kathryn Salas      Age: 58 y.o.       Sex:  female  Date:   10/22/2023 Today's Healthcare Provider: Bernardino KANDICE Cone, MD  ==============================   Chief Complaint: Fatigue (Pt states she if feeling little better.) and Anxiety (Pt was at hospital about two weeks ago having panic attacks and they gave her really small dose of medication that really help her with that would like to discuss that today as well. Lorazepam  0.5mg  is the medication was given at the hospital.)  Discussed the use of AI scribe software for clinical note transcription with the patient, who gave verbal consent to proceed.  History of Present Illness 58 year old female who presents with fatigue, hair loss, cold sensitivity, and constipation.  She experiences persistent fatigue, hair loss, cold sensitivity, and constipation. Her weight has remained stable between 149 to 151 pounds for over a year. She follows a mostly gluten-free diet, consuming very little bread, pasta, and chocolate. Reducing gluten has improved her constipation, as she is now using the bathroom more regularly. However, her hair loss continues.  She describes a deep, tingling, nervous sensation throughout her body, which she associates with a panic attack experienced two weeks ago. This sensation is intermittent and uncomfortable, occurring during work but not present at the time of the visit. She visited the ER for a panic attack two weeks ago, experiencing chest pain, arm pain, shaking, and fluctuating blood pressure. She was treated with lorazepam , which alleviated her symptoms.  She experiences significant leg pain from the hips down, which she suspects may be related to her cholesterol medication. She takes trazodone every night for sleep and has been advised to use lorazepam  for panic attacks. She is concerned  about the potential for future panic attacks and the impact of her symptoms on her daily life.  Wt Readings from Last 10 Encounters:  10/22/23 149 lb 3.2 oz (67.7 kg)  10/05/23 149 lb 0.5 oz (67.6 kg)  10/03/23 149 lb (67.6 kg)  09/03/23 151 lb 9.6 oz (68.8 kg)  06/06/23 146 lb 12.8 oz (66.6 kg)  04/14/23 151 lb 6.4 oz (68.7 kg)  04/03/23 150 lb 3.2 oz (68.1 kg)  11/22/22 150 lb 3.2 oz (68.1 kg)  08/30/22 151 lb (68.5 kg)  07/03/22 150 lb 6.4 oz (68.2 kg)    Panic attack went to emergency room 2 weeks ago- has had these symptoms since then intermittent since then. She feels like a deep tingling nervous sensation throughout her whole body. Acute coronary syndrome workup was negative.  Ativan  totally resolved all these symptoms briefly.  The tingling sensation is connected with the anxiety sensation.  She  Also new achy crampy starting after statin. leg pain from hip down she thinks from the cholesterol.  Intermittent adherent to gluten free diet and it seems to have improved all other symptoms, except hair loss (fatigue constipation)Updated Problem List Entries: Problem  Generalized Anxiety Disorder  Panic Disorder  Mixed Obsessional Thoughts and Acts  Gluten Intolerance  Myalgia Due to Statin  Hair Loss  Paresthesia    Background Reviewed: Problem List: has Gastroesophageal reflux disease; Chronic back pain; History of colonic polyps; History of asthma; Chronic maxillary sinusitis; Allergic rhinitis; ETD (Eustachian tube dysfunction), bilateral; Language barrier affecting health care; Nephrolithiasis; Chest tightness; Other fatigue; Hyperlipidemia; History of toxoplasmosis; Vitamin D  deficiency; Generalized anxiety disorder; Panic disorder; Mixed  obsessional thoughts and acts; Gluten intolerance; Myalgia due to statin; Hair loss; and Paresthesia on their problem list. 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 Past Surgical History:   has no past surgical history on file. Social History:   reports that she has never smoked. She has never used smokeless tobacco. She reports that she does not drink alcohol and does not use drugs. Family History:  family history includes Breast cancer in her maternal aunt; Cancer in her father, maternal aunt, and paternal grandmother; Depression in her mother; Hypertension in her mother. Allergies:  is allergic to codeine, morphine and codeine, latex, and pamelor [nortriptyline hcl].   Medication Reconciliation: Current Outpatient Medications on File Prior to Visit  Medication Sig   albuterol  (VENTOLIN  HFA) 108 (90 Base) MCG/ACT inhaler Inhale 2 puffs into the lungs every 6 (six) hours as needed for wheezing or shortness of breath.   Multiple Vitamins-Minerals (CENTRUM MINIS WOMEN 50+) TABS Take 1 tablet by mouth daily at 6 (six) AM.   rosuvastatin  (CRESTOR ) 10 MG tablet Take 1 tablet (10 mg total) by mouth daily.   Vitamin D , Ergocalciferol , (DRISDOL ) 1.25 MG (50000 UNIT) CAPS capsule Take 1 capsule (50,000 Units total) by mouth every 7 (seven) days. Once WEEKLY   azithromycin  (ZITHROMAX ) 250 MG tablet Take 250 mg by mouth daily. (Patient not taking: Reported on 06/06/2023)   benzonatate  (TESSALON ) 100 MG capsule Take by mouth 3 (three) times daily as needed for cough. (Patient not taking: Reported on 06/06/2023)   Cholecalciferol 125 MCG (5000 UT) TABS Take by mouth. (Patient not taking: Reported on 06/06/2023)   famotidine (PEPCID) 40 MG tablet Take 40 mg by mouth daily. (Patient not taking: Reported on 06/06/2023)   fluticasone  (FLONASE ) 50 MCG/ACT nasal spray Place 2 sprays into both nostrils daily. Use after simply saline sinus rinse at night    fluticasone -salmeterol (ADVAIR) 250-50 MCG/ACT AEPB SMARTSIG:By Mouth (Patient not taking: Reported on 06/06/2023)   ibuprofen (ADVIL) 800 MG tablet Take by mouth as needed for headache.   ipratropium-albuterol  (DUONEB) 0.5-2.5 (3) MG/3ML SOLN Take 3 mLs by nebulization every 4 (four) hours as needed.   levalbuterol  (XOPENEX  HFA) 45 MCG/ACT inhaler Inhale 1 puff into the lungs every 4 (four) hours as needed for wheezing. Take as rescue inhaler for shortness of breath or wheezing. (Patient not taking: Reported on 06/06/2023)   loratadine  (CLARITIN ) 10 MG tablet Take 1 tablet (10 mg total) by mouth daily.   MELOXICAM PO Take by mouth as needed.   montelukast (SINGULAIR) 10 MG tablet Take 10 mg by mouth daily. (Patient not taking: Reported on 06/06/2023)   omeprazole (PRILOSEC) 40 MG capsule Take 40 mg by mouth daily. (Patient not taking: Reported on 06/06/2023)   promethazine -dextromethorphan  (PROMETHAZINE -DM) 6.25-15 MG/5ML syrup Take 5 mLs by mouth 4 (four) times daily as needed.   Respiratory Therapy Supplies (NEBULIZER/TUBING/MOUTHPIECE) KIT 1 each by Does not apply route every 4 (four) hours as needed. (Patient not taking: Reported on 10/03/2023)   Saline (SIMPLY SALINE) 0.9 % AERS Place 2 each into the nose as directed. Use nightly for sinus hygiene long-term.  Can also be used as many times daily as desired to assist with clearing congested sinuses. (Patient not taking: Reported on 06/06/2023)   traZODone (DESYREL) 50 MG tablet Take 50 mg  by mouth at bedtime. (Patient not taking: Reported on 06/06/2023)   vitamin E 1000 UNIT capsule Take by mouth daily. Not sure of dosage (Patient not taking: Reported on 06/06/2023)   No current facility-administered medications on file prior to visit.  There are no discontinued medications.   Physical Exam:    10/22/2023   11:07 AM 10/05/2023    2:00 PM 10/05/2023   11:15 AM  Vitals with BMI  Height 5' 4    Weight 149 lbs 3 oz    BMI 25.6    Systolic 120 110  132  Diastolic 76 67 84  Pulse 67 62 80  Vital signs reviewed.  Nursing notes reviewed. Weight trend reviewed. Physical Exam General Appearance:  No acute distress appreciable.   Well-groomed, healthy-appearing female.  Well proportioned with no abnormal fat distribution.  Good muscle tone. Pulmonary:  Normal work of breathing at rest, no respiratory distress apparent. SpO2: 98 %  Musculoskeletal: All extremities are intact.  Neurological:  Awake, alert, oriented, and engaged.  No obvious focal neurological deficits or cognitive impairments.  Sensorium seems unclouded.   Speech is clear and coherent with logical content. Psychiatric:  Appropriate mood, pleasant and cooperative demeanor, thoughtful and engaged during the exam Hospital Outpatient Visit on 10/06/2023  Component Date Value Ref Range Status   Uric Acid, Serum 10/06/2023 5.8  2.5 - 7.1 mg/dL Final   Cholesterol 92/78/7974 220 (H)  0 - 200 mg/dL Final   Triglycerides 92/78/7974 61  <150 mg/dL Final   HDL 92/78/7974 66  >40 mg/dL Final   Total CHOL/HDL Ratio 10/06/2023 3.3  RATIO Final   VLDL 10/06/2023 12  0 - 40 mg/dL Final   LDL Cholesterol 10/06/2023 142 (H)  0 - 99 mg/dL Final   CCP Antibodies IgG/IgA 10/06/2023 4  0 - 19 units Final   Anti Nuclear Antibody (ANA) 10/06/2023 Negative  Negative Final   SSA (Ro) (ENA) Antibody, IgG 10/06/2023 0.2  0.0 - 0.9 AI Final   SSB (La) (ENA) Antibody, IgG 10/06/2023 <0.2  0.0 - 0.9 AI Final   Acety choline binding ab 10/06/2023 <0.07  0.00 - 0.24 nmol/L Final   Lyme Total Antibody EIA 10/06/2023 Negative  Negative Final   C3 Complement 10/06/2023 186 (H)  82 - 167 mg/dL Final   Complement C4, Body Fluid 10/06/2023 46 (H)  12 - 38 mg/dL Final   Toxoplasma IgG Ratio 10/06/2023 256.0 (H)  0.0 - 7.1 IU/mL Final   Toxoplasma Antibody- IgM 10/06/2023 <3.0  0.0 - 7.9 AU/mL Final   Comment 10/06/2023 Comment   Final   Sed Rate 10/06/2023 28  0 - 30 mm/hr Final   CRP 10/06/2023 0.6  <1.0  mg/dL Final   Total CK 92/78/7974 111  38 - 234 U/L Final   Iron 10/06/2023 75  28 - 170 ug/dL Final   TIBC 92/78/7974 391  250 - 450 ug/dL Final   Saturation Ratios 10/06/2023 19  10.4 - 31.8 % Final   UIBC 10/06/2023 316  ug/dL Final   Ferritin 92/78/7974 60  11 - 307 ng/mL Final   Specimen Source 10/06/2023 URINE, CLEAN CATCH   Final   Color, Urine 10/06/2023 YELLOW  YELLOW Final   APPearance 10/06/2023 CLEAR  CLEAR Final   Specific Gravity, Urine 10/06/2023 1.020  1.005 - 1.030 Final   pH 10/06/2023 5.0  5.0 - 8.0 Final   Glucose, UA 10/06/2023 NEGATIVE  NEGATIVE mg/dL Final   Hgb urine dipstick 10/06/2023 NEGATIVE  NEGATIVE  Final   Bilirubin Urine 10/06/2023 NEGATIVE  NEGATIVE Final   Ketones, ur 10/06/2023 NEGATIVE  NEGATIVE mg/dL Final   Protein, ur 92/78/7974 NEGATIVE  NEGATIVE mg/dL Final   Nitrite 92/78/7974 NEGATIVE  NEGATIVE Final   Leukocytes,Ua 10/06/2023 NEGATIVE  NEGATIVE Final   RBC / HPF 10/06/2023 0-5  0 - 5 RBC/hpf Final   WBC, UA 10/06/2023 0-5  0 - 5 WBC/hpf Final   Bacteria, UA 10/06/2023 NONE SEEN  NONE SEEN Final   Squamous Epithelial / HPF 10/06/2023 0-5  0 - 5 /HPF Final   Mucus 10/06/2023 PRESENT   Final   Retic Ct Pct 10/06/2023 0.8  0.4 - 3.1 % Final   RBC. 10/06/2023 5.01  3.87 - 5.11 MIL/uL Final   Retic Count, Absolute 10/06/2023 39.6  19.0 - 186.0 K/uL Final   Immature Retic Fract 10/06/2023 7.2  2.3 - 15.9 % Final   IgG (Immunoglobin G), Serum 10/06/2023 1,183  586 - 1,602 mg/dL Final   IgA 92/78/7974 167  87 - 352 mg/dL Final   IgM (Immunoglobulin M), Srm 10/06/2023 151  26 - 217 mg/dL Final  Admission on 92/79/7974, Discharged on 10/05/2023  Component Date Value Ref Range Status   Glucose-Capillary 10/05/2023 94  70 - 99 mg/dL Final   Magnesium 92/79/7974 2.1  1.7 - 2.4 mg/dL Final   Sodium 92/79/7974 139  135 - 145 mmol/L Final   Potassium 10/05/2023 3.4 (L)  3.5 - 5.1 mmol/L Final   Chloride 10/05/2023 102  98 - 111 mmol/L Final   CO2  10/05/2023 22  22 - 32 mmol/L Final   Glucose, Bld 10/05/2023 98  70 - 99 mg/dL Final   BUN 92/79/7974 19  6 - 20 mg/dL Final   Creatinine, Ser 10/05/2023 0.80  0.44 - 1.00 mg/dL Final   Calcium  10/05/2023 9.9  8.9 - 10.3 mg/dL Final   Total Protein 92/79/7974 8.2 (H)  6.5 - 8.1 g/dL Final   Albumin 92/79/7974 4.6  3.5 - 5.0 g/dL Final   AST 92/79/7974 29  15 - 41 U/L Final   ALT 10/05/2023 23  0 - 44 U/L Final   Alkaline Phosphatase 10/05/2023 70  38 - 126 U/L Final   Total Bilirubin 10/05/2023 0.6  0.0 - 1.2 mg/dL Final   GFR, Estimated 10/05/2023 >60  >60 mL/min Final   Anion gap 10/05/2023 15  5 - 15 Final   Troponin I (High Sensitivity) 10/05/2023 3  <18 ng/L Final   WBC 10/05/2023 6.9  4.0 - 10.5 K/uL Final   RBC 10/05/2023 4.96  3.87 - 5.11 MIL/uL Final   Hemoglobin 10/05/2023 14.4  12.0 - 15.0 g/dL Final   HCT 92/79/7974 43.1  36.0 - 46.0 % Final   MCV 10/05/2023 86.9  80.0 - 100.0 fL Final   MCH 10/05/2023 29.0  26.0 - 34.0 pg Final   MCHC 10/05/2023 33.4  30.0 - 36.0 g/dL Final   RDW 92/79/7974 11.9  11.5 - 15.5 % Final   Platelets 10/05/2023 260  150 - 400 K/uL Final   nRBC 10/05/2023 0.0  0.0 - 0.2 % Final   Neutrophils Relative % 10/05/2023 42  % Final   Neutro Abs 10/05/2023 2.9  1.7 - 7.7 K/uL Final   Lymphocytes Relative 10/05/2023 48  % Final   Lymphs Abs 10/05/2023 3.3  0.7 - 4.0 K/uL Final   Monocytes Relative 10/05/2023 7  % Final   Monocytes Absolute 10/05/2023 0.5  0.1 - 1.0 K/uL Final  Eosinophils Relative 10/05/2023 2  % Final   Eosinophils Absolute 10/05/2023 0.2  0.0 - 0.5 K/uL Final   Basophils Relative 10/05/2023 1  % Final   Basophils Absolute 10/05/2023 0.0  0.0 - 0.1 K/uL Final   Immature Granulocytes 10/05/2023 0  % Final   Abs Immature Granulocytes 10/05/2023 0.02  0.00 - 0.07 K/uL Final   D-Dimer, Quant 10/05/2023 0.36  0.00 - 0.50 ug/mL-FEU Final   Troponin I (High Sensitivity) 10/05/2023 3  <18 ng/L Final  Hospital Outpatient Visit on  09/17/2023  Component Date Value Ref Range Status   TSH 09/17/2023 1.490  0.450 - 4.500 uIU/mL Final   T4, Total 09/17/2023 8.2  4.5 - 12.0 ug/dL Final   T3 Uptake Ratio 09/17/2023 24  24 - 39 % Final   Free Thyroxine Index 09/17/2023 2.0  1.2 - 4.9 Final   CRP 09/17/2023 0.7  <1.0 mg/dL Final   Sed Rate 92/97/7974 30  0 - 30 mm/hr Final   Rheumatoid fact SerPl-aCnc 09/17/2023 <10.0  <14.0 IU/mL Final   Folate 09/17/2023 16.8  >5.9 ng/mL Final   Vitamin B-12 09/17/2023 319  180 - 914 pg/mL Final   Vit D, 25-Hydroxy 09/17/2023 25.36 (L)  30 - 100 ng/mL Final   CCP Antibodies IgG/IgA 09/17/2023 11  0 - 19 units Final  Hospital Outpatient Visit on 09/04/2023  Component Date Value Ref Range Status   Vit D, 25-Hydroxy 09/04/2023 25.75 (L)  30 - 100 ng/mL Final   WBC 09/04/2023 6.8  4.0 - 10.5 K/uL Final   RBC 09/04/2023 4.54  3.87 - 5.11 MIL/uL Final   Hemoglobin 09/04/2023 13.6  12.0 - 15.0 g/dL Final   HCT 93/80/7974 39.9  36.0 - 46.0 % Final   MCV 09/04/2023 87.9  80.0 - 100.0 fL Final   MCH 09/04/2023 30.0  26.0 - 34.0 pg Final   MCHC 09/04/2023 34.1  30.0 - 36.0 g/dL Final   RDW 93/80/7974 12.2  11.5 - 15.5 % Final   Platelets 09/04/2023 211  150 - 400 K/uL Final   nRBC 09/04/2023 0.0  0.0 - 0.2 % Final   Neutrophils Relative % 09/04/2023 59  % Final   Neutro Abs 09/04/2023 4.0  1.7 - 7.7 K/uL Final   Lymphocytes Relative 09/04/2023 31  % Final   Lymphs Abs 09/04/2023 2.1  0.7 - 4.0 K/uL Final   Monocytes Relative 09/04/2023 7  % Final   Monocytes Absolute 09/04/2023 0.5  0.1 - 1.0 K/uL Final   Eosinophils Relative 09/04/2023 3  % Final   Eosinophils Absolute 09/04/2023 0.2  0.0 - 0.5 K/uL Final   Basophils Relative 09/04/2023 0  % Final   Basophils Absolute 09/04/2023 0.0  0.0 - 0.1 K/uL Final   Immature Granulocytes 09/04/2023 0  % Final   Abs Immature Granulocytes 09/04/2023 0.01  0.00 - 0.07 K/uL Final   TSH 09/04/2023 1.280  0.450 - 4.500 uIU/mL Final   T4, Total  09/04/2023 9.1  4.5 - 12.0 ug/dL Final   T3 Uptake Ratio 09/04/2023 28  24 - 39 % Final   Free Thyroxine Index 09/04/2023 2.5  1.2 - 4.9 Final  Office Visit on 06/06/2023  Component Date Value Ref Range Status   Amylase 06/06/2023 32  21 - 101 U/L Final   WBC 06/06/2023 9.3  3.8 - 10.8 Thousand/uL Final   RBC 06/06/2023 5.04  3.80 - 5.10 Million/uL Final   Hemoglobin 06/06/2023 14.2  11.7 - 15.5 g/dL Final  HCT 06/06/2023 43.0  35.0 - 45.0 % Final   MCV 06/06/2023 85.3  80.0 - 100.0 fL Final   MCH 06/06/2023 28.2  27.0 - 33.0 pg Final   MCHC 06/06/2023 33.0  32.0 - 36.0 g/dL Final   RDW 96/78/7974 12.4  11.0 - 15.0 % Final   Platelets 06/06/2023 231  140 - 400 Thousand/uL Final   MPV 06/06/2023 10.8  7.5 - 12.5 fL Final   Neutro Abs 06/06/2023 4,455  1,500 - 7,800 cells/uL Final   Absolute Lymphocytes 06/06/2023 4,204 (H)  850 - 3,900 cells/uL Final   Absolute Monocytes 06/06/2023 586  200 - 950 cells/uL Final   Eosinophils Absolute 06/06/2023 37  15 - 500 cells/uL Final   Basophils Absolute 06/06/2023 19  0 - 200 cells/uL Final   Neutrophils Relative % 06/06/2023 47.9  % Final   Total Lymphocyte 06/06/2023 45.2  % Final   Monocytes Relative 06/06/2023 6.3  % Final   Eosinophils Relative 06/06/2023 0.4  % Final   Basophils Relative 06/06/2023 0.2  % Final   Glucose, Bld 06/06/2023 86  65 - 99 mg/dL Final   BUN 96/78/7974 26 (H)  7 - 25 mg/dL Final   Creat 96/78/7974 0.92  0.50 - 1.03 mg/dL Final   eGFR 96/78/7974 73  > OR = 60 mL/min/1.14m2 Final   BUN/Creatinine Ratio 06/06/2023 28 (H)  6 - 22 (calc) Final   Sodium 06/06/2023 141  135 - 146 mmol/L Final   Potassium 06/06/2023 3.7  3.5 - 5.3 mmol/L Final   Chloride 06/06/2023 103  98 - 110 mmol/L Final   CO2 06/06/2023 27  20 - 32 mmol/L Final   Calcium  06/06/2023 9.4  8.6 - 10.4 mg/dL Final   Total Protein 96/78/7974 7.7  6.1 - 8.1 g/dL Final   Albumin 96/78/7974 4.4  3.6 - 5.1 g/dL Final   Globulin 96/78/7974 3.3  1.9 - 3.7  g/dL (calc) Final   AG Ratio 06/06/2023 1.3  1.0 - 2.5 (calc) Final   Total Bilirubin 06/06/2023 0.3  0.2 - 1.2 mg/dL Final   Alkaline phosphatase (APISO) 06/06/2023 60  37 - 153 U/L Final   AST 06/06/2023 16  10 - 35 U/L Final   ALT 06/06/2023 14  6 - 29 U/L Final   Hepatitis C Ab 06/06/2023 NON-REACTIVE  NON-REACTIVE Final   HIV 1&2 Ab, 4th Generation 06/06/2023 NON-REACTIVE  NON-REACTIVE Final   Lipase 06/06/2023 40  7 - 60 U/L Final  Admission on 11/12/2022, Discharged on 11/12/2022  Component Date Value Ref Range Status   Color, UA 11/12/2022 yellow  yellow Final   Clarity, UA 11/12/2022 hazy (A)  clear Final   Glucose, UA 11/12/2022 negative  negative mg/dL Final   Bilirubin, UA 91/72/7975 negative  negative Final   Ketones, POC UA 11/12/2022 negative  negative mg/dL Final   Spec Grav, UA 91/72/7975 >=1.030 (A)  1.010 - 1.025 Final   Blood, UA 11/12/2022 trace-intact (A)  negative Final   pH, UA 11/12/2022 5.5  5.0 - 8.0 Final   Protein Ur, POC 11/12/2022 negative  negative mg/dL Final   Urobilinogen, UA 11/12/2022 0.2  0.2 or 1.0 E.U./dL Final   Nitrite, UA 91/72/7975 Negative  Negative Final   Leukocytes, UA 11/12/2022 Negative  Negative Final   Specimen Description 11/12/2022    Final                   Value:URINE, CLEAN CATCH Performed at [facility], [address] [place], [place] [address]    Special Requests 11/12/2022    Final                   Value:NONE Performed at [facility], [address], [place], [place] [address]    Culture 11/12/2022    Final                   Value:NO GROWTH Performed at [facility], [address] [place], [place] [address]    Report Status 11/12/2022 11/13/2022 FINAL   Final  Office Visit on 07/03/2022  Component Date Value Ref Range Status   COLOGUARD 09/05/2022 Negative  Negative Final  No image results found. DG Chest Port 1 View Result Date: 10/05/2023 CLINICAL DATA:  Chest pain EXAM: PORTABLE CHEST 1 VIEW COMPARISON:   None Available. FINDINGS: Lungs are well expanded, symmetric, and clear save for linear scarring at the left lung base. No pneumothorax or pleural effusion. Cardiac size within normal limits. Pulmonary vascularity is normal. Osseous structures are age-appropriate. No acute bone abnormality. IMPRESSION: No active disease. Electronically Signed   By: Dorethia Molt M.D.   On: 10/05/2023 11:29   DG Chest 2 View Result Date: 10/03/2023 CLINICAL DATA:  Fatigue.  Dyspnea on exertion. EXAM: CHEST - 2 VIEW COMPARISON:  Radiograph 06/01/2023 FINDINGS: The cardiomediastinal contours are normal. Bandlike subsegmental scarring at the left lung base, unchanged. Pulmonary vasculature is normal. No consolidation, pleural effusion, or pneumothorax. No acute osseous abnormalities are seen. IMPRESSION: No active cardiopulmonary disease. Electronically Signed   By: Andrea Gasman M.D.   On: 10/03/2023 19:57         ASSESSMENT & PLAN   Assessment & Plan Panic disorder Paresthesia Generalized anxiety disorder Mixed obsessional thoughts and acts Anxiety disorder with panic attacks and mild obsessive-compulsive features   Symptoms include panic attacks, chest and arm pain, shaking, and breathlessness, likely due to neurotransmitter imbalance. Lorazepam  was effective during an ER visit, but due to its addictive nature, alternative management is necessary. Anxiety may cause other symptoms, so managing it is crucial to prevent recurrence of panic attacks. Further evaluation for neurotransmitter imbalance due to mental health issues or rare tumors is planned. Prescribe Zoloft  for anxiety and OCD features. Provide lorazepam  for emergency use during panic attacks. Order a 24-hour urine test to evaluate neurotransmitter levels. Recommend follow-up in one month to adjust medications and review progress.  Paresthesia (deep tingling sensation) under evaluation   Described as a deep tingling, nervous sensation throughout the  body, intermittent and associated with anxiety. Further evaluation is needed to determine if related to neurotransmitter imbalance or other causes. Order a 24-hour urine test to evaluate neurotransmitter levels. Gluten intolerance Constipation, improved with dietary modification   Symptoms improved with increased intake of fruits and vegetables and reduced gluten consumption. Hyperlipidemia, unspecified hyperlipidemia type Myalgia due to statin Chronic leg pain likely statin-induced myalgia   Pain is described as achy and crampy, starting after initiation of cholesterol medication. Discontinue cholesterol medication for one week to assess impact on leg pain.  Currently managed with statin therapy. Plan to discontinue statin temporarily to assess if it is the cause of leg pain. Discontinue statin therapy for one week to assess impact on leg pain. Hair loss Hair loss and fatigue under evaluation for secondary causes  - most likely diagnosis gluten intolerance Thyroid  dysfunction has been ruled out. Symptoms are not fully explained by anxiety. A gluten-free diet improved other symptoms but not hair loss.  Further evaluation is needed to determine secondary causes Language barrier affecting health care Daughter is outstanding interpreter and attended today's visit Used spanish text also to support patient understanding   Return to clinic 1 month assess new urine test and medication(s) effectiveness and continued gluten restriction.    ORDER ASSOCIATIONS  #   DIAGNOSIS / CONDITION ICD-10 ENCOUNTER ORDER     ICD-10-CM   1. Panic disorder  F41.0 LORazepam  (ATIVAN ) 1 MG tablet    Catecholamines, fractionated, Urine, 24 hour    5 HIAA, quantitative, urine, 24 hour    2. Mixed obsessional thoughts and acts  F42.2 sertraline  (ZOLOFT ) 25 MG tablet    3. Generalized anxiety disorder  F41.1     4. Gluten intolerance  K90.41     5. Myalgia due to statin  M79.10    T46.6X5A     6. Hair loss   L65.9     7. Hyperlipidemia, unspecified hyperlipidemia type  E78.5     8. Paresthesia  R20.2     9. Language barrier affecting health care  Z60.3    Z75.8      Meds ordered this encounter  Medications   LORazepam  (ATIVAN ) 1 MG tablet    Sig: Take 1 tablet (1 mg total) by mouth 2 (two) times daily as needed for anxiety.    Dispense:  20 tablet    Refill:  0   sertraline  (ZOLOFT ) 25 MG tablet    Sig: Take 1 tablet (25 mg total) by mouth daily.    Dispense:  30 tablet    Refill:  3    Orders Placed This Encounter  Procedures   Catecholamines, fractionated, Urine, 24 hour   5 HIAA, quantitative, urine, 24 hour    Standing Status:   Future    Expiration Date:   10/21/2024      This document was synthesized by artificial intelligence ([employer]) using HIPAA-compliant recording of the clinical interaction;   We discussed the use of AI scribe software for clinical note transcription with the patient, who gave verbal consent to proceed. additional Info: This encounter employed state-of-the-art, real-time, collaborative documentation. The patient actively reviewed and assisted in updating their electronic medical record on a shared screen, ensuring transparency and facilitating joint problem-solving for the problem list, overview, and plan. This approach promotes accurate, informed care. The treatment plan was discussed and reviewed in detail, including medication safety, potential side effects, and all patient questions. We confirmed understanding and comfort with the plan. Follow-up instructions were established, including contacting the office for any concerns, returning if symptoms worsen, persist, or new symptoms develop, and precautions for potential emergency department visits.

## 2023-10-22 NOTE — Patient Instructions (Addendum)
 It was a pleasure seeing you today! Your health and satisfaction are our top priorities.  Kathryn Cone, MD  VISIT SUMMARY: During your visit, we discussed your ongoing symptoms of fatigue, hair loss, cold sensitivity, and constipation. We also addressed your recent panic attack and associated symptoms, as well as your concerns about leg pain and the potential side effects of your cholesterol medication.  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 We will start you on Zoloft  to help manage your anxiety and OCD features. You will also have lorazepam  available for emergency use during panic attacks. We have ordered a 24-hour urine test to evaluate your neurotransmitter levels and will follow up in one month to adjust your medications and review your progress.  -HAIR LOSS AND FATIGUE: We are still evaluating the cause of your hair loss and fatigue. Thyroid  dysfunction has been ruled out, and while your gluten-free diet has improved other symptoms, it has not helped with hair loss. Further evaluation is needed to determine the cause.  -CONSTIPATION: Your constipation has improved with dietary changes, including increased intake of fruits and vegetables and reduced gluten consumption. Continue with these dietary modifications.  -CHRONIC LEG PAIN: Your leg pain may be caused by your cholesterol medication (statin). We will discontinue your statin for one week to see if this improves your leg pain.  -PARESTHESIA (DEEP TINGLING SENSATION): Paresthesia is a tingling or nervous sensation that can be related to anxiety or other causes. We will evaluate this further with a 24-hour urine test to check your neurotransmitter levels.  -HYPERLIPIDEMIA: Hyperlipidemia is high cholesterol, which you are currently managing with statin therapy. We will temporarily  stop your statin for one week to see if it is causing your leg pain.  INSTRUCTIONS: Please follow up in one month to adjust your medications and review your progress. Complete the 24-hour urine test as ordered to evaluate your neurotransmitter levels.  Your Providers PCP: [name] MATSU, MD,  [phone]) Referring Provider: [name] MATSU, MD,  [phone]) Care Team Provider: Harvey Margo CROME, MD Care Team Provider: Driscilla Wanda SQUIBB, RN Care Team Provider: Harl Eleanor LABOR, NP,  [phone])  NEXT STEPS: [x]  Early Intervention: Schedule sooner appointment, call our on-call services, or go to emergency room if there is any significant Increase in pain or discomfort New or worsening symptoms Sudden or severe changes in your health [x]  Flexible Follow-Up: We recommend a Return in about 1 month (around 11/22/2023). for optimal routine care. This allows for progress monitoring and treatment adjustments. [x]  Preventive Care: Schedule your annual preventive care visit! It's typically covered by insurance and helps identify potential health issues early. [x]  Lab & X-ray Appointments: Incomplete tests scheduled today, or call to schedule. X-rays: [facility] (M-F, 8:30am-noon or 1pm-5pm). [x]  Medical Information Release: Sign a release form at front desk to obtain relevant medical information we don't have.  MAKING THE MOST OF OUR FOCUSED 20 MINUTE APPOINTMENTS: [x]   Clearly state your top concerns at the beginning of the visit to focus our discussion [x]   If you anticipate you will need more time, please inform the front desk during scheduling - we can book multiple appointments in the same week. [x]   If you have transportation problems- use our convenient video appointments or ask about transportation support. [x]   We can get down to business faster if you use MyChart  to update information before the visit and submit non-urgent questions before your visit. Thank  you for taking the time to provide details through MyChart.  Let our nurse know and she can import this information into your encounter documents.  Arrival and Wait Times: [x]   Arriving on time ensures that everyone receives prompt attention. [x]   Early morning (8a) and afternoon (1p) appointments tend to have shortest wait times. [x]   Unfortunately, we cannot delay appointments for late arrivals or hold slots during phone calls.  Getting Answers and Following Up [x]   Simple Questions & Concerns: For quick questions or basic follow-up after your visit, reach us  at [phone] or MyChart messaging. [x]   Complex Concerns: If your concern is more complex, scheduling an appointment might be best. Discuss this with the staff to find the most suitable option. [x]   Lab & Imaging Results: We'll contact you directly if results are abnormal or you don't use MyChart. Most normal results will be on MyChart within 2-3 business days, with a review message from Dr. Jesus. Haven't heard back in 2 weeks? Need results sooner? Contact us  at [phone]. [x]   Referrals: Our referral coordinator will manage specialist referrals. The specialist's office should contact you within 2 weeks to schedule an appointment. Call us  if you haven't heard from them after 2 weeks.  Staying Connected [x]   MyChart: Activate your MyChart for the fastest way to access results and message us . See the last page of this paperwork for instructions on how to activate.  Bring to Your Next Appointment [x]   Medications: Please bring all your medication bottles to your next appointment to ensure we have an accurate record of your prescriptions. [x]   Health Diaries: If you're monitoring any health conditions at home, keeping a diary of your readings can be very helpful for discussions at your next appointment.  Billing [x]   X-ray & Lab Orders: These are billed by separate companies. Contact the invoicing company directly for questions  or concerns. [x]   Visit Charges: Discuss any billing inquiries with our administrative services team.  Your Satisfaction Matters [x]   Share Your Experience: We strive for your satisfaction! If you have any complaints, or preferably compliments, please let Dr. Jesus know directly or contact our Practice Administrators, Manuelita Rubin or [employer], by asking at the front desk.   Reviewing Your Records [x]   Review this early draft of your clinical encounter notes below and the final encounter summary tomorrow on MyChart after its been completed.  All orders placed so far are visible here: Panic disorder -     LORazepam ; Take 1 tablet (1 mg total) by mouth 2 (two) times daily as needed for anxiety.  Dispense: 20 tablet; Refill: 0 -     Catecholamines, fractionated, urine, 24 hour -     5 HIAA, quantitative, urine, 24 hour; Future  Mixed obsessional thoughts and acts -     Sertraline  HCl; Take 1 tablet (25 mg total) by mouth daily.  Dispense: 30 tablet; Refill: 3  Generalized anxiety disorder  Gluten intolerance  Myalgia due to statin  Hair loss  Hyperlipidemia, unspecified hyperlipidemia type  Paresthesia  Language barrier affecting health care        Informacin sobre el Trastorno Obsesivo-Compulsivo (TOC) Gracias por responder la encuesta sobre sntomas relacionados con el Trastorno Obsesivo-Compulsivo (TOC). Segn sus respuestas: Usted identifica [place] a la suciedad. A veces tiene pensamientos o conductas repetitivas relacionadas con este [place]. Estos pensamientos o conductas no le quitan mucho tiempo cada  da ni interfieren significativamente con su vida diaria. Usted reconoce que a veces es Haematologist o TEFL teacher estos pensamientos o comportamientos. Qu significa esto? Muchas personas pueden tener pensamientos no deseados o preocupaciones ocasionales sobre la limpieza o la contaminacin, especialmente en situaciones de estrs. El TOC se diagnostica cuando  estos pensamientos (obsesiones) y comportamientos (compulsiones) son frecuentes, difciles de Chief Operating Officer, y Biomedical scientist significativamente la vida diaria, el trabajo o las [place]. En su caso, aunque reconoce cierta dificultad para controlar estos pensamientos, no parecen estar interfiriendo gravemente con su vida diaria ni ocupan una gran parte de su tiempo. Qu puede hacer? Observar: Preste atencin a la frecuencia e intensidad de estos pensamientos o conductas. Si nota que aumentan o empiezan a Audiological scientist su vida, es importante buscar ayuda. Hablar: Si le preocupa el tema o si los sntomas empeoran, comntelo con su mdico o un profesional de salud mental. Ellos pueden ayudarle a entender mejor lo que est experimentando y ofrecerle apoyo o tratamiento si es necesario. Cuidar su bienestar: Practique tcnicas de relajacin, mantenga una rutina saludable y busque actividades que le ayuden a reducir Development worker, community. Cundo buscar ayuda? Si los pensamientos o conductas se vuelven ms frecuentes, intensos o difciles de controlar. Si empiezan a interferir con su trabajo, estudios, relaciones o [address] diarias. Si siente ansiedad o malestar significativo por estos pensamientos o comportamientos. Recuerde: No est sola. Muchas personas experimentan preocupaciones similares y hay tratamientos efectivos disponibles si alguna vez los necesita.  Le gustara que personalice el folleto con recursos locales o informacin adicional?

## 2023-11-04 ENCOUNTER — Ambulatory Visit: Payer: Self-pay | Admitting: Internal Medicine

## 2023-11-09 LAB — 5 HIAA, QUANTITATIVE, URINE, 24 HOUR
5 HIAA, 24 Hour Urine: 4.1 mg/(24.h) (ref <=6.0)
Total Volume: 1300 mL

## 2023-11-09 LAB — 5-HYDROXYINDOLEACETIC ACID (5-HIAA), 24-HOUR UR W/O CREATININE
5 HIAA, 24 Hour Urine: 4.1 mg/(24.h) (ref <=6.0)
Total Volume: 1300 mL

## 2023-11-09 LAB — CATECHOLAMINES, FRACTIONATED, URINE, 24 HOUR
Calc Total (E+NE): 26 ug/(24.h) (ref 26–121)
Creatinine, Urine mg/day-CATEUR: 0.8 g/(24.h) (ref 0.50–2.15)
Dopamine 24 Hr Urine: 174 ug/(24.h) (ref 52–480)
Epinephrine, 24H, Ur: 7 ug/(24.h) (ref 2–24)
Norepinephrine, 24H, Ur: 19 ug/(24.h) (ref 15–100)
Total Volume: 1300 mL

## 2023-11-09 LAB — TIQ- AMBIGUOUS ORDER

## 2023-11-14 NOTE — Telephone Encounter (Signed)
 read by Brent Ambrosia at 7:46PM on 11/13/2023.

## 2023-11-24 ENCOUNTER — Encounter: Payer: Self-pay | Admitting: Internal Medicine

## 2023-11-24 ENCOUNTER — Ambulatory Visit (INDEPENDENT_AMBULATORY_CARE_PROVIDER_SITE_OTHER): Payer: Self-pay | Admitting: Internal Medicine

## 2023-11-24 VITALS — BP 110/62 | HR 88 | Temp 98.0°F | Ht 64.0 in | Wt 149.0 lb

## 2023-11-24 DIAGNOSIS — E785 Hyperlipidemia, unspecified: Secondary | ICD-10-CM

## 2023-11-24 DIAGNOSIS — F410 Panic disorder [episodic paroxysmal anxiety] without agoraphobia: Secondary | ICD-10-CM

## 2023-11-24 DIAGNOSIS — R5383 Other fatigue: Secondary | ICD-10-CM

## 2023-11-24 DIAGNOSIS — F422 Mixed obsessional thoughts and acts: Secondary | ICD-10-CM

## 2023-11-24 DIAGNOSIS — M797 Fibromyalgia: Secondary | ICD-10-CM

## 2023-11-24 DIAGNOSIS — G720 Drug-induced myopathy: Secondary | ICD-10-CM

## 2023-11-24 DIAGNOSIS — F5101 Primary insomnia: Secondary | ICD-10-CM

## 2023-11-24 DIAGNOSIS — L659 Nonscarring hair loss, unspecified: Secondary | ICD-10-CM

## 2023-11-24 MED ORDER — MIRTAZAPINE 7.5 MG PO TABS: 7.5000 mg | ORAL_TABLET | Freq: Every evening | ORAL | 2 refills | Status: DC | PRN

## 2023-11-24 MED ORDER — ATORVASTATIN CALCIUM 10 MG PO TABS: 10.0000 mg | ORAL_TABLET | Freq: Every day | ORAL | 3 refills | Status: DC

## 2023-11-24 MED ORDER — DULOXETINE HCL 30 MG PO CPEP: 30.0000 mg | ORAL_CAPSULE | Freq: Every day | ORAL | 3 refills | Status: DC

## 2023-11-24 NOTE — Assessment & Plan Note (Signed)
 Leg cramps resolved after discontinuing rosuvastatin , confirming statin-induced myalgia. Switch to atorvastatin  and monitor for recurrence of leg cramps.

## 2023-11-24 NOTE — Assessment & Plan Note (Signed)
 Panic disorder and generalized anxiety disorder   She experiences panic attacks, especially during stressful situations like driving tests, with increased anxiety noted. 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 Increase sertraline  to 50 mg daily. Provide a handout on mindfulness and breathing exercises. Refer to a psychologist for further evaluation and management.

## 2023-11-24 NOTE — Assessment & Plan Note (Signed)
 Hair loss, fatigue, cold sensitivity, and constipation likely stress-related   Symptoms are likely stress-related. Thyroid  dysfunction was considered but ruled out. Stress management is crucial, and vitamin deficiencies are also a possibility. Recommend a multivitamin with minerals. Address stress through medication and mindfulness techniques.

## 2023-11-24 NOTE — Assessment & Plan Note (Signed)
 Diagnosed after ruling out other conditions, she experiences widespread pain, particularly in the back, with variable intensity. Fibromyalgia results from hypersensitization of nerve endings and is not life-threatening but can be debilitating. Prescribe Cymbalta  to replace Zoloft  for fibromyalgia and panic attacks. Educate on the nature of fibromyalgia and its non-progressive nature. Encourage exercise, particularly TRX suspension training, to manage symptoms.

## 2023-11-24 NOTE — Patient Instructions (Addendum)
 It was a pleasure seeing you today! Your health and satisfaction are our top priorities.  Kathryn Salas, Kathryn Salas  VISIT SUMMARY: During your visit, we discussed your panic attacks, fibromyalgia symptoms, and other health concerns. We reviewed your current medications and made some adjustments to better manage your conditions. We also talked about lifestyle changes and stress management techniques to help improve your overall well-being.  YOUR PLAN: -PANIC DISORDER AND GENERALIZED ANXIETY DISORDER: Panic disorder involves sudden episodes of intense fear, often triggered by stress. Given your family history, this condition may be genetic. We will increase your sertraline  dose to 50 mg daily and provide you with a handout on mindfulness and breathing exercises. You will also be referred to a psychologist for further evaluation and management.  -FIBROMYALGIA: Fibromyalgia is a condition characterized by widespread pain due to hypersensitive nerve endings. It is not life-threatening but can be debilitating. We will switch your medication from Zoloft  to Cymbalta , which can help manage both fibromyalgia and panic attacks. Exercise, particularly TRX suspension training, is encouraged to help manage symptoms.  -HYPERLIPIDEMIA: Hyperlipidemia is a condition where there are high levels of fats in the blood, increasing the risk of heart disease. You were previously on rosuvastatin  but stopped due to leg cramps. We will switch you to atorvastatin  and monitor for any side effects like leg cramps.  -STATIN-INDUCED MYALGIA: Statin-induced myalgia is muscle pain caused by statin medications. Your leg cramps resolved after stopping rosuvastatin , confirming this diagnosis. We will switch you to atorvastatin  and monitor for any recurrence of leg cramps.  -HAIR LOSS, FATIGUE, COLD SENSITIVITY, AND CONSTIPATION LIKELY STRESS-RELATED: These symptoms are likely related to stress, as thyroid  dysfunction has been ruled out. Managing  stress is crucial, and we recommend taking a multivitamin with minerals. We will also address stress through medication and mindfulness techniques.  INSTRUCTIONS: Please follow up with the psychologist as referred for further evaluation and management of your anxiety and panic attacks. Monitor for any side effects from the new medications, especially leg cramps from atorvastatin , and report them immediately. Continue with the mindfulness and breathing exercises provided in the handout. Consider incorporating TRX suspension training into your exercise routine to help manage fibromyalgia symptoms. Take the recommended multivitamin with minerals daily.  Your Providers PCP: [name] MATSU, Kathryn Salas,  [phone]) Referring Provider: [name] MATSU, Kathryn Salas,  [phone]) Care Team Provider: Harvey Margo CROME, Kathryn Salas Care Team Provider: Driscilla Wanda SQUIBB, RN Care Team Provider: Harl Eleanor LABOR, NP,  [phone])  NEXT STEPS: [x]  Early Intervention: Schedule sooner appointment, call our on-call services, or go to emergency room if there is any significant Increase in pain or discomfort New or worsening symptoms Sudden or severe changes in your health [x]  Flexible Follow-Up: We recommend a Return in about 1 month (around 12/24/2023) for follow up medication changes. for optimal routine care. This allows for progress monitoring and treatment adjustments. [x]  Preventive Care: Schedule your annual preventive care visit! It's typically covered by insurance and helps identify potential health issues early. [x]  Lab & X-ray Appointments: Incomplete tests scheduled today, or call to schedule. X-rays: [facility] (M-F, 8:30am-noon or 1pm-5pm). [x]  Medical Information Release: Sign a release form at front desk to obtain relevant medical information we don't have.  MAKING THE MOST OF OUR FOCUSED 20 MINUTE APPOINTMENTS: [x]   Clearly state your top concerns at the beginning of the visit to focus  our discussion [x]   If you anticipate you will need more time, please inform the front desk  during scheduling - we can book multiple appointments in the same week. [x]   If you have transportation problems- use our convenient video appointments or ask about transportation support. [x]   We can get down to business faster if you use MyChart to update information before the visit and submit non-urgent questions before your visit. Thank you for taking the time to provide details through MyChart.  Let our nurse know and she can import this information into your encounter documents.  Arrival and Wait Times: [x]   Arriving on time ensures that everyone receives prompt attention. [x]   Early morning (8a) and afternoon (1p) appointments tend to have shortest wait times. [x]   Unfortunately, we cannot delay appointments for late arrivals or hold slots during phone calls.  Getting Answers and Following Up [x]   Simple Questions & Concerns: For quick questions or basic follow-up after your visit, reach us  at [phone] or MyChart messaging. [x]   Complex Concerns: If your concern is more complex, scheduling an appointment might be best. Discuss this with the staff to find the most suitable option. [x]   Lab & Imaging Results: We'll contact you directly if results are abnormal or you don't use MyChart. Most normal results will be on MyChart within 2-3 business days, with a review message from Dr. Jesus. Haven't heard back in 2 weeks? Need results sooner? Contact us  at [phone]. [x]   Referrals: Our referral coordinator will manage specialist referrals. The specialist's office should contact you within 2 weeks to schedule an appointment. Call us  if you haven't heard from them after 2 weeks.  Staying Connected [x]   MyChart: Activate your MyChart for the fastest way to access results and message us . See the last page of this paperwork for instructions on how to activate.  Bring to Your Next Appointment [x]    Medications: Please bring all your medication bottles to your next appointment to ensure we have an accurate record of your prescriptions. [x]   Health Diaries: If you're monitoring any health conditions at home, keeping a diary of your readings can be very helpful for discussions at your next appointment.  Billing [x]   X-ray & Lab Orders: These are billed by separate companies. Contact the invoicing company directly for questions or concerns. [x]   Visit Charges: Discuss any billing inquiries with our administrative services team.  Your Satisfaction Matters [x]   Share Your Experience: We strive for your satisfaction! If you have any complaints, or preferably compliments, please let Dr. Jesus know directly or contact our Practice Administrators, Manuelita Rubin or [employer], by asking at the front desk.   Reviewing Your Records [x]   Review this early draft of your clinical encounter notes below and the final encounter summary tomorrow on MyChart after its been completed.  All orders placed so far are visible here: Statin myopathy  Hyperlipidemia, unspecified hyperlipidemia type -     Atorvastatin  Calcium ; Take 1 tablet (10 mg total) by mouth daily. Replaces rosuvastatin   Dispense: 90 tablet; Refill: 3  Mixed obsessional thoughts and acts -     DULoxetine  HCl; Take 1 capsule (30 mg total) by mouth daily.  Dispense: 30 capsule; Refill: 3  Panic disorder -     Ambulatory referral to Psychiatry -     Ambulatory referral to Psychology -     DULoxetine  HCl; Take 1 capsule (30 mg total) by mouth daily.  Dispense: 30 capsule; Refill: 3  Fibromyalgia -     DULoxetine  HCl; Take 1 capsule (30 mg total) by mouth daily.  Dispense: 30  capsule; Refill: 3  Primary insomnia -     Mirtazapine ; Take 1 tablet (7.5 mg total) by mouth at bedtime as needed. Replaces trazodone.  Dispense: 30 tablet; Refill: 2  Other fatigue  Hair loss          It was a pleasure seeing you today! Your health and  satisfaction are our top priorities.  Kathryn Salas, Kathryn Salas       Your Providers PCP: [name] MATSU, Kathryn Salas,  [phone]) Referring Provider: [name] MATSU, Kathryn Salas,  [phone]) Care Team Provider: Harvey Margo CROME, Kathryn Salas Care Team Provider: Driscilla Wanda SQUIBB, RN Care Team Provider: Harl Eleanor LABOR, NP,  [phone])  NEXT STEPS: [x]  Early Intervention: Schedule sooner appointment, call our on-call services, or go to emergency room if there is any significant Increase in pain or discomfort New or worsening symptoms Sudden or severe changes in your health [x]  Flexible Follow-Up: We recommend a No follow-ups on file. for optimal routine care. This allows for progress monitoring and treatment adjustments. [x]  Preventive Care: Schedule your annual preventive care visit! It's typically covered by insurance and helps identify potential health issues early. [x]  Lab & X-ray Appointments: Incomplete tests scheduled today, or call to schedule. X-rays: [facility] (M-F, 8:30am-noon or 1pm-5pm). [x]  Medical Information Release: Sign a release form at front desk to obtain relevant medical information we don't have.  MAKING THE MOST OF OUR FOCUSED 20 MINUTE APPOINTMENTS: [x]   Clearly state your top concerns at the beginning of the visit to focus our discussion [x]   If you anticipate you will need more time, please inform the front desk during scheduling - we can book multiple appointments in the same week. [x]   If you have transportation problems- use our convenient video appointments or ask about transportation support. [x]   We can get down to business faster if you use MyChart to update information before the visit and submit non-urgent questions before your visit. Thank you for taking the time to provide details through MyChart.  Let our nurse know and she can import this information into your encounter documents.  Arrival and Wait Times: [x]   Arriving on time ensures that  everyone receives prompt attention. [x]   Early morning (8a) and afternoon (1p) appointments tend to have shortest wait times. [x]   Unfortunately, we cannot delay appointments for late arrivals or hold slots during phone calls.  Getting Answers and Following Up [x]   Simple Questions & Concerns: For quick questions or basic follow-up after your visit, reach us  at [phone] or MyChart messaging. [x]   Complex Concerns: If your concern is more complex, scheduling an appointment might be best. Discuss this with the staff to find the most suitable option. [x]   Lab & Imaging Results: We'll contact you directly if results are abnormal or you don't use MyChart. Most normal results will be on MyChart within 2-3 business days, with a review message from Dr. [facility]. Haven't heard back in 2 weeks? Need results sooner? Contact us  at [phone]. [x]   Referrals: Our referral coordinator will manage specialist referrals. The specialist's office should contact you within 2 weeks to schedule an appointment. Call us  if you haven't heard from them after 2 weeks.  Staying Connected [x]   MyChart: Activate your MyChart for the fastest way to access results and message us . See the last page of this paperwork for instructions on how to activate.  Bring to Your Next Appointment [x]   Medications: Please bring all your medication bottles to your next appointment  to ensure we have an accurate record of your prescriptions. [x]   Health Diaries: If you're monitoring any health conditions at home, keeping a diary of your readings can be very helpful for discussions at your next appointment.  Billing [x]   X-ray & Lab Orders: These are billed by separate companies. Contact the invoicing company directly for questions or concerns. [x]   Visit Charges: Discuss any billing inquiries with our administrative services team.  Your Satisfaction Matters [x]   Share Your Experience: We strive for your satisfaction! If you have any  complaints, or preferably compliments, please let Dr. Jesus know directly or contact our Practice Administrators, Manuelita Rubin or [employer], by asking at the front desk.   Reviewing Your Records [x]   Review this early draft of your clinical encounter notes below and the final encounter summary tomorrow on MyChart after its been completed.  All orders placed so far are visible here: Statin myopathy  Hyperlipidemia, unspecified hyperlipidemia type  Mixed obsessional thoughts and acts  Panic disorder

## 2023-11-24 NOTE — Progress Notes (Signed)
 ==============================  [facility]: [phone]   -- Medical Office Visit --  Patient: Kathryn Salas      Age: 58 y.o.       Sex:  female  Date:   11/24/2023 Today's Healthcare Provider: Bernardino KANDICE Cone, MD  ==============================   Chief Complaint: Fatigue/brain fog/myalgias/panic attacks  Discussed the use of AI scribe software for clinical note transcription with the patient, who gave verbal consent to proceed.  History of Present Illness 58 year old female who presents with panic attacks and fibromyalgia symptoms.  She experiences panic attacks, particularly during stressful situations such as a driving test at the [facility] She feels very nervous and sad after failing the test, which exacerbated her anxiety. She has had four panic attacks in the past two months.  She experiences chronic pain. She wakes up with pain in her back, which can vary in intensity from day to day. She previously took a statin for cholesterol but stopped due to leg cramps, which resolved after discontinuation. She has not been taking any other medications for pain management.  She reports symptoms of hair loss, fatigue, cold sensitivity, and constipation. These symptoms have persisted despite stopping rosuvastatin .  Her family history includes a mother with depression and panic attacks, and a son with autism and similar mental health issues. She is concerned about the genetic component of her condition.  Socially, she prefers being around a small number of people and finds large groups overwhelming. She has difficulty managing stress and feels unable to control it, which contributes to her anxiety and panic attacks.  Background Reviewed: Problem List: has Gastroesophageal reflux disease; Chronic back pain; History of colonic polyps; History of asthma; Chronic maxillary sinusitis; Allergic rhinitis; ETD (Eustachian tube dysfunction), bilateral; Language  barrier affecting health care; Nephrolithiasis; Chest tightness; Other fatigue; Hyperlipidemia; History of toxoplasmosis; Vitamin D  deficiency; Generalized anxiety disorder; Panic disorder; Mixed obsessional thoughts and acts; Gluten intolerance; Myalgia due to statin; Hair loss; Paresthesia; Statin myopathy; and Fibromyalgia on their problem list. 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 Past Surgical History:   has no past surgical history on file. Social History:   reports that she has never smoked. She has never used smokeless tobacco. She reports that she does not drink alcohol and does not use drugs. Family History:  family history includes Breast cancer in her maternal aunt; Cancer in her father, maternal aunt, and paternal grandmother; Depression in her mother; Hypertension in her mother. Allergies:  is allergic to codeine, morphine and codeine, latex, and pamelor [nortriptyline hcl].   Medication Reconciliation: Current Outpatient Medications on File Prior to Visit  Medication Sig   albuterol  (VENTOLIN  HFA) 108 (90 Base) MCG/ACT inhaler Inhale 2 puffs into the lungs every 6 (six) hours as needed for wheezing or shortness of breath.   fluticasone  (FLONASE ) 50 MCG/ACT nasal spray Place 2 sprays into both nostrils daily. Use after simply saline sinus rinse at night   ibuprofen (ADVIL) 800 MG tablet Take by mouth as needed for headache.   ipratropium-albuterol  (DUONEB) 0.5-2.5 (3) MG/3ML SOLN Take 3 mLs by nebulization every 4 (four) hours as needed.   loratadine  (CLARITIN ) 10 MG tablet Take 1 tablet (10 mg total) by mouth daily.   LORazepam  (ATIVAN ) 1 MG tablet Take 1 tablet (1  mg total) by mouth 2 (two)  times daily as needed for anxiety.   MELOXICAM PO Take by mouth as needed.   Multiple Vitamins-Minerals (CENTRUM MINIS WOMEN 50+) TABS Take 1 tablet by mouth daily at 6 (six) AM.   promethazine -dextromethorphan  (PROMETHAZINE -DM) 6.25-15 MG/5ML syrup Take 5 mLs by mouth 4 (four) times daily as needed.   sertraline  (ZOLOFT ) 25 MG tablet Take 1 tablet (25 mg total) by mouth daily.   Vitamin D , Ergocalciferol , (DRISDOL ) 1.25 MG (50000 UNIT) CAPS capsule Take 1 capsule (50,000 Units total) by mouth every 7 (seven) days. Once WEEKLY   No current facility-administered medications on file prior to visit.   Medications Discontinued During This Encounter  Medication Reason   rosuvastatin  (CRESTOR ) 10 MG tablet Side effect (s)   omeprazole (PRILOSEC) 40 MG capsule    vitamin E 1000 UNIT capsule    Cholecalciferol 125 MCG (5000 UT) TABS    traZODone (DESYREL) 50 MG tablet    montelukast (SINGULAIR) 10 MG tablet    omeprazole (PRILOSEC) 40 MG capsule    fluticasone -salmeterol (ADVAIR) 250-50 MCG/ACT AEPB    Saline (SIMPLY SALINE) 0.9 % AERS    levalbuterol  (XOPENEX  HFA) 45 MCG/ACT inhaler    benzonatate  (TESSALON ) 100 MG capsule    famotidine (PEPCID) 40 MG tablet    azithromycin  (ZITHROMAX ) 250 MG tablet    Respiratory Therapy Supplies (NEBULIZER/TUBING/MOUTHPIECE) KIT      Physical Exam:    11/24/2023    2:44 PM 10/22/2023   11:07 AM 10/05/2023    2:00 PM  Vitals with BMI  Height 5' 4 5' 4   Weight 149 lbs 149 lbs 3 oz   BMI 25.56 25.6   Systolic 110 120 889  Diastolic 62 76 67  Pulse 88 67 62  Vital signs reviewed.  Nursing notes reviewed. Weight trend reviewed. Physical Activity: Not on file   General Appearance:  No acute distress appreciable.   Well-groomed, healthy-appearing female.  Well proportioned with no abnormal fat distribution.  Good muscle tone. Pulmonary:  Normal work of breathing at rest, no respiratory distress apparent.    Musculoskeletal: All extremities are intact.   Neurological:  Awake, alert, oriented, and engaged.  No obvious focal neurological deficits or cognitive impairments.  Sensorium seems unclouded.   Speech is clear and coherent with logical content. Psychiatric:  Appropriate mood, pleasant and cooperative demeanor, thoughtful and engaged during the exam   Verbalized to patient: Physical Exam    Results:   Verbalized to patient: Results      06/06/2023    4:09 PM 07/03/2022    8:27 AM  PHQ 2/9 Scores  PHQ - 2 Score 0 0   Office Visit on 10/22/2023  Component Date Value Ref Range Status   Total Volume 10/24/2023 1,300  mL Final   Epinephrine, 24H, Ur 10/24/2023 7  2 - 24 mcg/24 h Final   Norepinephrine, 24H, Ur 10/24/2023 19  15 - 100 mcg/24 h Final   Calc Total (E+NE) 10/24/2023 26  26 - 121 mcg/24 h Final   Dopamine 24 Hr Urine 10/24/2023 174  52 - 480 mcg/24 h Final   Creatinine, Urine mg/day-CATEUR 10/24/2023 0.80  0.50 - 2.15 g/24 h Final   Total Volume 10/24/2023 1,300  mL Final   5 HIAA, 24 Hour Urine 10/24/2023 4.1  < OR = 6.0 mg/24 h Final   QUESTION/PROBLEM 10/24/2023    Final   UNCLEAR ORDER: 10/24/2023 VERY PRIORITY   Final   SPECIMEN(S) SUBMITTED 10/24/2023  UF   Final   Total Volume 10/24/2023 1,300  mL Final   5 HIAA, 24 Hour Urine 10/24/2023 4.1  < OR = 6.0 mg/24 h Final  Hospital Outpatient Visit on 10/06/2023  Component Date Value Ref Range Status   Uric Acid, Serum 10/06/2023 5.8  2.5 - 7.1 mg/dL Final   Cholesterol 92/78/7974 220 (H)  0 - 200 mg/dL Final   Triglycerides 92/78/7974 61  <150 mg/dL Final   HDL 92/78/7974 66  >40 mg/dL Final   Total CHOL/HDL Ratio 10/06/2023 3.3  RATIO Final   VLDL 10/06/2023 12  0 - 40 mg/dL Final   LDL Cholesterol 10/06/2023 142 (H)  0 - 99 mg/dL Final   CCP Antibodies IgG/IgA 10/06/2023 4  0 - 19 units Final   Anti Nuclear Antibody (ANA) 10/06/2023 Negative  Negative Final   SSA (Ro) (ENA) Antibody, IgG 10/06/2023 0.2  0.0 - 0.9 AI Final   SSB (La) (ENA) Antibody, IgG  10/06/2023 <0.2  0.0 - 0.9 AI Final   Acety choline binding ab 10/06/2023 <0.07  0.00 - 0.24 nmol/L Final   Lyme Total Antibody EIA 10/06/2023 Negative  Negative Final   C3 Complement 10/06/2023 186 (H)  82 - 167 mg/dL Final   Complement C4, Body Fluid 10/06/2023 46 (H)  12 - 38 mg/dL Final   Toxoplasma IgG Ratio 10/06/2023 256.0 (H)  0.0 - 7.1 IU/mL Final   Toxoplasma Antibody- IgM 10/06/2023 <3.0  0.0 - 7.9 AU/mL Final   Comment 10/06/2023 Comment   Final   Sed Rate 10/06/2023 28  0 - 30 mm/hr Final   CRP 10/06/2023 0.6  <1.0 mg/dL Final   Total CK 92/78/7974 111  38 - 234 U/L Final   Iron 10/06/2023 75  28 - 170 ug/dL Final   TIBC 92/78/7974 391  250 - 450 ug/dL Final   Saturation Ratios 10/06/2023 19  10.4 - 31.8 % Final   UIBC 10/06/2023 316  ug/dL Final   Ferritin 92/78/7974 60  11 - 307 ng/mL Final   Specimen Source 10/06/2023 URINE, CLEAN CATCH   Final   Color, Urine 10/06/2023 YELLOW  YELLOW Final   APPearance 10/06/2023 CLEAR  CLEAR Final   Specific Gravity, Urine 10/06/2023 1.020  1.005 - 1.030 Final   pH 10/06/2023 5.0  5.0 - 8.0 Final   Glucose, UA 10/06/2023 NEGATIVE  NEGATIVE mg/dL Final   Hgb urine dipstick 10/06/2023 NEGATIVE  NEGATIVE Final   Bilirubin Urine 10/06/2023 NEGATIVE  NEGATIVE Final   Ketones, ur 10/06/2023 NEGATIVE  NEGATIVE mg/dL Final   Protein, ur 92/78/7974 NEGATIVE  NEGATIVE mg/dL Final   Nitrite 92/78/7974 NEGATIVE  NEGATIVE Final   Leukocytes,Ua 10/06/2023 NEGATIVE  NEGATIVE Final   RBC / HPF 10/06/2023 0-5  0 - 5 RBC/hpf Final   WBC, UA 10/06/2023 0-5  0 - 5 WBC/hpf Final   Bacteria, UA 10/06/2023 NONE SEEN  NONE SEEN Final   Squamous Epithelial / HPF 10/06/2023 0-5  0 - 5 /HPF Final   Mucus 10/06/2023 PRESENT   Final   Retic Ct Pct 10/06/2023 0.8  0.4 - 3.1 % Final   RBC. 10/06/2023 5.01  3.87 - 5.11 MIL/uL Final   Retic Count, Absolute 10/06/2023 39.6  19.0 - 186.0 K/uL Final   Immature Retic Fract 10/06/2023 7.2  2.3 - 15.9 % Final   IgG  (Immunoglobin G), Serum 10/06/2023 1,183  586 - 1,602 mg/dL Final   IgA 92/78/7974 167  87 - 352 mg/dL Final  IgM (Immunoglobulin M), Srm 10/06/2023 151  26 - 217 mg/dL Final  Admission on 92/79/7974, Discharged on 10/05/2023  Component Date Value Ref Range Status   Glucose-Capillary 10/05/2023 94  70 - 99 mg/dL Final   Magnesium 92/79/7974 2.1  1.7 - 2.4 mg/dL Final   Sodium 92/79/7974 139  135 - 145 mmol/L Final   Potassium 10/05/2023 3.4 (L)  3.5 - 5.1 mmol/L Final   Chloride 10/05/2023 102  98 - 111 mmol/L Final   CO2 10/05/2023 22  22 - 32 mmol/L Final   Glucose, Bld 10/05/2023 98  70 - 99 mg/dL Final   BUN 92/79/7974 19  6 - 20 mg/dL Final   Creatinine, Ser 10/05/2023 0.80  0.44 - 1.00 mg/dL Final   Calcium  10/05/2023 9.9  8.9 - 10.3 mg/dL Final   Total Protein 92/79/7974 8.2 (H)  6.5 - 8.1 g/dL Final   Albumin 92/79/7974 4.6  3.5 - 5.0 g/dL Final   AST 92/79/7974 29  15 - 41 U/L Final   ALT 10/05/2023 23  0 - 44 U/L Final   Alkaline Phosphatase 10/05/2023 70  38 - 126 U/L Final   Total Bilirubin 10/05/2023 0.6  0.0 - 1.2 mg/dL Final   GFR, Estimated 10/05/2023 >60  >60 mL/min Final   Anion gap 10/05/2023 15  5 - 15 Final   Troponin I (High Sensitivity) 10/05/2023 3  <18 ng/L Final   WBC 10/05/2023 6.9  4.0 - 10.5 K/uL Final   RBC 10/05/2023 4.96  3.87 - 5.11 MIL/uL Final   Hemoglobin 10/05/2023 14.4  12.0 - 15.0 g/dL Final   HCT 92/79/7974 43.1  36.0 - 46.0 % Final   MCV 10/05/2023 86.9  80.0 - 100.0 fL Final   MCH 10/05/2023 29.0  26.0 - 34.0 pg Final   MCHC 10/05/2023 33.4  30.0 - 36.0 g/dL Final   RDW 92/79/7974 11.9  11.5 - 15.5 % Final   Platelets 10/05/2023 260  150 - 400 K/uL Final   nRBC 10/05/2023 0.0  0.0 - 0.2 % Final   Neutrophils Relative % 10/05/2023 42  % Final   Neutro Abs 10/05/2023 2.9  1.7 - 7.7 K/uL Final   Lymphocytes Relative 10/05/2023 48  % Final   Lymphs Abs 10/05/2023 3.3  0.7 - 4.0 K/uL Final   Monocytes Relative 10/05/2023 7  % Final    Monocytes Absolute 10/05/2023 0.5  0.1 - 1.0 K/uL Final   Eosinophils Relative 10/05/2023 2  % Final   Eosinophils Absolute 10/05/2023 0.2  0.0 - 0.5 K/uL Final   Basophils Relative 10/05/2023 1  % Final   Basophils Absolute 10/05/2023 0.0  0.0 - 0.1 K/uL Final   Immature Granulocytes 10/05/2023 0  % Final   Abs Immature Granulocytes 10/05/2023 0.02  0.00 - 0.07 K/uL Final   D-Dimer, Quant 10/05/2023 0.36  0.00 - 0.50 ug/mL-FEU Final   Troponin I (High Sensitivity) 10/05/2023 3  <18 ng/L Final  Hospital Outpatient Visit on 09/17/2023  Component Date Value Ref Range Status   TSH 09/17/2023 1.490  0.450 - 4.500 uIU/mL Final   T4, Total 09/17/2023 8.2  4.5 - 12.0 ug/dL Final   T3 Uptake Ratio 09/17/2023 24  24 - 39 % Final   Free Thyroxine Index 09/17/2023 2.0  1.2 - 4.9 Final   CRP 09/17/2023 0.7  <1.0 mg/dL Final   Sed Rate 92/97/7974 30  0 - 30 mm/hr Final   Rheumatoid fact SerPl-aCnc 09/17/2023 <10.0  <14.0 IU/mL Final  Folate 09/17/2023 16.8  >5.9 ng/mL Final   Vitamin B-12 09/17/2023 319  180 - 914 pg/mL Final   Vit D, 25-Hydroxy 09/17/2023 25.36 (L)  30 - 100 ng/mL Final   CCP Antibodies IgG/IgA 09/17/2023 11  0 - 19 units Final  Hospital Outpatient Visit on 09/04/2023  Component Date Value Ref Range Status   Vit D, 25-Hydroxy 09/04/2023 25.75 (L)  30 - 100 ng/mL Final   WBC 09/04/2023 6.8  4.0 - 10.5 K/uL Final   RBC 09/04/2023 4.54  3.87 - 5.11 MIL/uL Final   Hemoglobin 09/04/2023 13.6  12.0 - 15.0 g/dL Final   HCT 93/80/7974 39.9  36.0 - 46.0 % Final   MCV 09/04/2023 87.9  80.0 - 100.0 fL Final   MCH 09/04/2023 30.0  26.0 - 34.0 pg Final   MCHC 09/04/2023 34.1  30.0 - 36.0 g/dL Final   RDW 93/80/7974 12.2  11.5 - 15.5 % Final   Platelets 09/04/2023 211  150 - 400 K/uL Final   nRBC 09/04/2023 0.0  0.0 - 0.2 % Final   Neutrophils Relative % 09/04/2023 59  % Final   Neutro Abs 09/04/2023 4.0  1.7 - 7.7 K/uL Final   Lymphocytes Relative 09/04/2023 31  % Final   Lymphs Abs  09/04/2023 2.1  0.7 - 4.0 K/uL Final   Monocytes Relative 09/04/2023 7  % Final   Monocytes Absolute 09/04/2023 0.5  0.1 - 1.0 K/uL Final   Eosinophils Relative 09/04/2023 3  % Final   Eosinophils Absolute 09/04/2023 0.2  0.0 - 0.5 K/uL Final   Basophils Relative 09/04/2023 0  % Final   Basophils Absolute 09/04/2023 0.0  0.0 - 0.1 K/uL Final   Immature Granulocytes 09/04/2023 0  % Final   Abs Immature Granulocytes 09/04/2023 0.01  0.00 - 0.07 K/uL Final   TSH 09/04/2023 1.280  0.450 - 4.500 uIU/mL Final   T4, Total 09/04/2023 9.1  4.5 - 12.0 ug/dL Final   T3 Uptake Ratio 09/04/2023 28  24 - 39 % Final   Free Thyroxine Index 09/04/2023 2.5  1.2 - 4.9 Final  Office Visit on 06/06/2023  Component Date Value Ref Range Status   Amylase 06/06/2023 32  21 - 101 U/L Final   WBC 06/06/2023 9.3  3.8 - 10.8 Thousand/uL Final   RBC 06/06/2023 5.04  3.80 - 5.10 Million/uL Final   Hemoglobin 06/06/2023 14.2  11.7 - 15.5 g/dL Final   HCT 96/78/7974 43.0  35.0 - 45.0 % Final   MCV 06/06/2023 85.3  80.0 - 100.0 fL Final   MCH 06/06/2023 28.2  27.0 - 33.0 pg Final   MCHC 06/06/2023 33.0  32.0 - 36.0 g/dL Final   RDW 96/78/7974 12.4  11.0 - 15.0 % Final   Platelets 06/06/2023 231  140 - 400 Thousand/uL Final   MPV 06/06/2023 10.8  7.5 - 12.5 fL Final   Neutro Abs 06/06/2023 4,455  1,500 - 7,800 cells/uL Final   Absolute Lymphocytes 06/06/2023 4,204 (H)  850 - 3,900 cells/uL Final   Absolute Monocytes 06/06/2023 586  200 - 950 cells/uL Final   Eosinophils Absolute 06/06/2023 37  15 - 500 cells/uL Final   Basophils Absolute 06/06/2023 19  0 - 200 cells/uL Final   Neutrophils Relative % 06/06/2023 47.9  % Final   Total Lymphocyte 06/06/2023 45.2  % Final   Monocytes Relative 06/06/2023 6.3  % Final   Eosinophils Relative 06/06/2023 0.4  % Final   Basophils Relative 06/06/2023 0.2  % Final  Glucose, Bld 06/06/2023 86  65 - 99 mg/dL Final   BUN 96/78/7974 26 (H)  7 - 25 mg/dL Final   Creat 96/78/7974  0.92  0.50 - 1.03 mg/dL Final   eGFR 96/78/7974 73  > OR = 60 mL/min/1.34m2 Final   BUN/Creatinine Ratio 06/06/2023 28 (H)  6 - 22 (calc) Final   Sodium 06/06/2023 141  135 - 146 mmol/L Final   Potassium 06/06/2023 3.7  3.5 - 5.3 mmol/L Final   Chloride 06/06/2023 103  98 - 110 mmol/L Final   CO2 06/06/2023 27  20 - 32 mmol/L Final   Calcium  06/06/2023 9.4  8.6 - 10.4 mg/dL Final   Total Protein 96/78/7974 7.7  6.1 - 8.1 g/dL Final   Albumin 96/78/7974 4.4  3.6 - 5.1 g/dL Final   Globulin 96/78/7974 3.3  1.9 - 3.7 g/dL (calc) Final   AG Ratio 06/06/2023 1.3  1.0 - 2.5 (calc) Final   Total Bilirubin 06/06/2023 0.3  0.2 - 1.2 mg/dL Final   Alkaline phosphatase (APISO) 06/06/2023 60  37 - 153 U/L Final   AST 06/06/2023 16  10 - 35 U/L Final   ALT 06/06/2023 14  6 - 29 U/L Final   Hepatitis C Ab 06/06/2023 NON-REACTIVE  NON-REACTIVE Final   HIV 1&2 Ab, 4th Generation 06/06/2023 NON-REACTIVE  NON-REACTIVE Final   Lipase 06/06/2023 40  7 - 60 U/L Final  Admission on 11/12/2022, Discharged on 11/12/2022  Component Date Value Ref Range Status   Color, UA 11/12/2022 yellow  yellow Final   Clarity, UA 11/12/2022 hazy (A)  clear Final   Glucose, UA 11/12/2022 negative  negative mg/dL Final   Bilirubin, UA 91/72/7975 negative  negative Final   Ketones, POC UA 11/12/2022 negative  negative mg/dL Final   Spec Grav, UA 91/72/7975 >=1.030 (A)  1.010 - 1.025 Final   Blood, UA 11/12/2022 trace-intact (A)  negative Final   pH, UA 11/12/2022 5.5  5.0 - 8.0 Final   Protein Ur, POC 11/12/2022 negative  negative mg/dL Final   Urobilinogen, UA 11/12/2022 0.2  0.2 or 1.0 E.U./dL Final   Nitrite, UA 91/72/7975 Negative  Negative Final   Leukocytes, UA 11/12/2022 Negative  Negative Final   Specimen Description 11/12/2022    Final                   Value:URINE, CLEAN CATCH Performed at [facility], [address] [place], [place] [address]    Special Requests 11/12/2022    Final                    Value:NONE Performed at [facility], [address], [place], [place] [address]    Culture 11/12/2022    Final                   Value:NO GROWTH Performed at [facility], [address], [place], [place] [address]    Report Status 11/12/2022 11/13/2022 FINAL   Final  Office Visit on 07/03/2022  Component Date Value Ref Range Status   COLOGUARD 09/05/2022 Negative  Negative Final  No image results found. DG Chest Port 1 View Result Date: 10/05/2023 CLINICAL DATA:  Chest pain EXAM: PORTABLE CHEST 1 VIEW COMPARISON:  None Available. FINDINGS: Lungs are well expanded, symmetric, and clear save for linear scarring at the left lung base. No pneumothorax or pleural effusion. Cardiac size within normal limits. Pulmonary vascularity is normal. Osseous structures are age-appropriate. No acute bone abnormality.  IMPRESSION: No active disease. Electronically Signed   By: Dorethia Molt M.D.   On: 10/05/2023 11:29   DG Chest 2 View Result Date: 10/03/2023 CLINICAL DATA:  Fatigue.  Dyspnea on exertion. EXAM: CHEST - 2 VIEW COMPARISON:  Radiograph 06/01/2023 FINDINGS: The cardiomediastinal contours are normal. Bandlike subsegmental scarring at the left lung base, unchanged. Pulmonary vasculature is normal. No consolidation, pleural effusion, or pneumothorax. No acute osseous abnormalities are seen. IMPRESSION: No active cardiopulmonary disease. Electronically Signed   By: Andrea Gasman M.D.   On: 10/03/2023 19:57         ASSESSMENT & PLAN   Assessment & Plan Statin myopathy Leg cramps resolved after discontinuing rosuvastatin , confirming statin-induced myalgia. Switch to atorvastatin  and monitor for recurrence of leg cramps. Hyperlipidemia, unspecified hyperlipidemia type Previously on rosuvastatin , which was discontinued due to leg cramps. Statins are important for preventing strokes and heart attacks. Switch from rosuvastatin  to atorvastatin . Monitor for leg cramps and discontinue if they  occur. Mixed obsessional thoughts and acts Primary insomnia Panic disorder Panic disorder and generalized anxiety disorder   She experiences panic attacks, especially during stressful situations like driving tests, with increased anxiety noted. 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 Increase sertraline  to 50 mg daily. Provide a handout on mindfulness and breathing exercises. Refer to a psychologist for further evaluation and management. Fibromyalgia Diagnosed after ruling out other conditions, she experiences widespread pain, particularly in the back, with variable intensity. Fibromyalgia results from hypersensitization of nerve endings and is not life-threatening but can be debilitating. Prescribe Cymbalta  to replace Zoloft  for fibromyalgia and panic attacks. Educate on the nature of fibromyalgia and its non-progressive nature. Encourage exercise, particularly TRX suspension training, to manage symptoms. Hair loss Other fatigue Hair loss, fatigue, cold sensitivity, and constipation likely stress-related   Symptoms are likely stress-related. Thyroid  dysfunction was considered but ruled out. Stress management is crucial, and vitamin deficiencies are also a possibility. Recommend a multivitamin with minerals. Address stress through medication and mindfulness techniques.   ORDER ASSOCIATIONS  #   DIAGNOSIS / CONDITION ICD-10 ENCOUNTER ORDER     ICD-10-CM   1. Statin myopathy  G72.0    T46.6X5A     2. Hyperlipidemia, unspecified hyperlipidemia type  E78.5 atorvastatin  (LIPITOR) 10 MG tablet    3. Mixed obsessional thoughts and acts  F42.2 DULoxetine  (CYMBALTA ) 30 MG capsule    4. Panic disorder  F41.0 Ambulatory referral to Psychiatry    Ambulatory referral to Psychology    DULoxetine  (CYMBALTA ) 30 MG capsule    5. Fibromyalgia  M79.7 DULoxetine  (CYMBALTA ) 30 MG capsule    6. Primary insomnia  F51.01 mirtazapine  (REMERON ) 7.5 MG tablet    7. Other fatigue  R53.83      8. Hair loss  L65.9          Orders Placed in Encounter:   Meds ordered this encounter  Medications   atorvastatin  (LIPITOR) 10 MG tablet    Sig: Take 1 tablet (10 mg total) by mouth daily. Replaces rosuvastatin     Dispense:  90 tablet    Refill:  3   DULoxetine  (CYMBALTA ) 30 MG capsule    Sig: Take 1 capsule (30 mg total) by mouth daily.    Dispense:  30 capsule    Refill:  3   mirtazapine  (REMERON ) 7.5 MG tablet    Sig: Take 1 tablet (7.5 mg total) by mouth at bedtime as needed. Replaces trazodone.    Dispense:  30 tablet  Refill:  2   Orders Placed This Encounter  Procedures   Ambulatory referral to Psychiatry    Referral Priority:   Routine    Referral Type:   Psychiatric    Referral Reason:   Specialty Services Required    Requested Specialty:   Psychiatry    Number of Visits Requested:   1   Ambulatory referral to Psychology    Referral Priority:   Routine    Referral Type:   Psychiatric    Referral Reason:   Specialty Services Required    Requested Specialty:   Psychology    Number of Visits Requested:   1       This document was synthesized by artificial intelligence ([employer]) using HIPAA-compliant recording of the clinical interaction;   We discussed the use of AI scribe software for clinical note transcription with the patient, who gave verbal consent to proceed. additional Info: This encounter employed state-of-the-art, real-time, collaborative documentation. The patient actively reviewed and assisted in updating their electronic medical record on a shared screen, ensuring transparency and facilitating joint problem-solving for the problem list, overview, and plan. This approach promotes accurate, informed care. The treatment plan was discussed and reviewed in detail, including medication safety, potential side effects, and all patient questions. We confirmed understanding and comfort with the plan. Follow-up instructions were established, including contacting the  office for any concerns, returning if symptoms worsen, persist, or new symptoms develop, and precautions for potential emergency department visits.

## 2023-11-24 NOTE — Assessment & Plan Note (Signed)
 Previously on rosuvastatin , which was discontinued due to leg cramps. Statins are important for preventing strokes and heart attacks. Switch from rosuvastatin  to atorvastatin . Monitor for leg cramps and discontinue if they occur.

## 2023-11-26 ENCOUNTER — Encounter: Payer: Self-pay | Admitting: Internal Medicine

## 2023-12-18 ENCOUNTER — Other Ambulatory Visit: Payer: Self-pay | Admitting: Medical Genetics

## 2023-12-19 ENCOUNTER — Other Ambulatory Visit (HOSPITAL_COMMUNITY)
Admission: RE | Admit: 2023-12-19 | Discharge: 2023-12-19 | Disposition: A | Discharge status: Home / Self Care | Payer: Self-pay | Source: Ambulatory Visit | Attending: Oncology | Admitting: Oncology

## 2023-12-24 ENCOUNTER — Ambulatory Visit: Admitting: Internal Medicine

## 2023-12-26 LAB — GENECONNECT MOLECULAR SCREEN: Genetic Analysis Overall Interpretation: NEGATIVE

## 2024-01-05 ENCOUNTER — Ambulatory Visit (INDEPENDENT_AMBULATORY_CARE_PROVIDER_SITE_OTHER): Admitting: Internal Medicine

## 2024-01-05 ENCOUNTER — Other Ambulatory Visit (HOSPITAL_BASED_OUTPATIENT_CLINIC_OR_DEPARTMENT_OTHER): Payer: Self-pay

## 2024-01-05 ENCOUNTER — Encounter: Payer: Self-pay | Admitting: Internal Medicine

## 2024-01-05 VITALS — BP 130/60 | HR 66 | Temp 98.2°F | Ht 64.0 in | Wt 145.2 lb

## 2024-01-05 DIAGNOSIS — K219 Gastro-esophageal reflux disease without esophagitis: Secondary | ICD-10-CM

## 2024-01-05 DIAGNOSIS — Z23 Encounter for immunization: Secondary | ICD-10-CM

## 2024-01-05 DIAGNOSIS — R1013 Epigastric pain: Secondary | ICD-10-CM

## 2024-01-05 DIAGNOSIS — K2281 Esophageal polyp: Secondary | ICD-10-CM

## 2024-01-05 DIAGNOSIS — F422 Mixed obsessional thoughts and acts: Secondary | ICD-10-CM

## 2024-01-05 DIAGNOSIS — Z603 Acculturation difficulty: Secondary | ICD-10-CM

## 2024-01-05 DIAGNOSIS — R5382 Chronic fatigue, unspecified: Secondary | ICD-10-CM

## 2024-01-05 DIAGNOSIS — R109 Unspecified abdominal pain: Secondary | ICD-10-CM | POA: Insufficient documentation

## 2024-01-05 DIAGNOSIS — E7800 Pure hypercholesterolemia, unspecified: Secondary | ICD-10-CM

## 2024-01-05 DIAGNOSIS — M797 Fibromyalgia: Secondary | ICD-10-CM

## 2024-01-05 DIAGNOSIS — Z789 Other specified health status: Secondary | ICD-10-CM | POA: Insufficient documentation

## 2024-01-05 DIAGNOSIS — K2100 Gastro-esophageal reflux disease with esophagitis, without bleeding: Secondary | ICD-10-CM

## 2024-01-05 DIAGNOSIS — R070 Pain in throat: Secondary | ICD-10-CM

## 2024-01-05 DIAGNOSIS — F5101 Primary insomnia: Secondary | ICD-10-CM

## 2024-01-05 MED ORDER — TRAZODONE HCL 50 MG PO TABS
25.0000 mg | ORAL_TABLET | Freq: Every evening | ORAL | 3 refills | Status: AC | PRN
  Filled 2024-01-05: qty 90, 90d supply, fill #0
  Filled 2024-04-02: qty 90, 90d supply, fill #1

## 2024-01-05 MED ORDER — HYDROCODONE-ACETAMINOPHEN 5-325 MG PO TABS
1.0000 | ORAL_TABLET | Freq: Four times a day (QID) | ORAL | 0 refills | Status: AC | PRN
  Filled 2024-01-05: qty 20, 5d supply, fill #0

## 2024-01-05 MED ORDER — ESOMEPRAZOLE MAGNESIUM 40 MG PO CPDR
40.0000 mg | DELAYED_RELEASE_CAPSULE | Freq: Every day | ORAL | 3 refills | Status: AC
  Filled 2024-01-05: qty 30, 30d supply, fill #0
  Filled 2024-04-02: qty 30, 30d supply, fill #1

## 2024-01-05 MED ORDER — SERTRALINE HCL 25 MG PO TABS
25.0000 mg | ORAL_TABLET | Freq: Every day | ORAL | 3 refills | Status: AC
  Filled 2024-01-05: qty 90, 90d supply, fill #0
  Filled 2024-04-02: qty 90, 90d supply, fill #1

## 2024-01-05 MED ORDER — SUCRALFATE 1 G PO TABS
1.0000 g | ORAL_TABLET | Freq: Three times a day (TID) | ORAL | 0 refills | Status: DC
  Filled 2024-01-05: qty 30, 8d supply, fill #0

## 2024-01-05 MED ORDER — JOURNAVX 50 MG PO TABS
1.0000 | ORAL_TABLET | Freq: Two times a day (BID) | ORAL | 5 refills | Status: AC | PRN
  Filled 2024-01-05: qty 60, fill #0

## 2024-01-05 NOTE — Patient Instructions (Addendum)
 It was a pleasure seeing you today! Your health and satisfaction are our top priorities.  Kathryn Cone, MD RESUMEN DE LA VISITA: Durante su visita, hablamos sobre su dolor abdominal severo, fatiga y dolor de Advertising copywriter. Revisamos su historial de fibromialgia, plipos esofgicos y experiencias con medicamentos. Tambin abordamos su dolor muscular relacionado con el uso de estatinas y el manejo de su colesterol. SU PLAN: REFLUJO SILENTE CON ESOFAGITIS Y COMPROMISO LARINGEO (LPR): Esta condicin ocurre cuando el cido del estmago regresa al esfago, causando dolor de garganta, ronquera y tos. Iniciaremos tratamiento con Nexium para prevenir el reflujo y Carafate para Archivist. Tambin puede usar agentes que recubren el esfago, como Pepto Bismol. Evite el ibuprofeno, ya que puede [employer], y trate de dormir con una almohada en forma de cua a un ngulo de 30 grados para prevenir el reflujo. Lo referiremos a un gastroenterlogo para una evaluacin adicional y posible biopsia de sus plipos esofgicos. FIBROMIALGIA: La fibromialgia es una condicin crnica caracterizada por dolor generalizado y fatiga. Continuaremos con sertralina para estabilizar el nimo y trazodona para dormir. Si sus sntomas persisten, podemos considerar Lyrica o gabapentina, aunque pueden causar somnolencia. INTOLERANCIA A ESTATINAS: La intolerancia a estatinas ocurre cuando aparece dolor muscular como efecto secundario de estos medicamentos. Como su dolor muscular desapareci al suspender atorvastatina, no la reiniciaremos. En su lugar, recomendamos controlar su colesterol mediante una dieta saludable. HIPERLIPIDEMIA: La hiperlipidemia significa tener niveles elevados de colesterol en la sangre. Dado su intolerancia a estatinas y predisposicin gentica, nos enfocaremos en cambios dietticos para controlar su colesterol. Evite carnes procesadas y alimentos fritos. INSTRUCCIONES ADICIONALES Y EXPLICACIONES: Reflujo y  esofagitis: Tome Nexium y Carafate segn lo indicado por su mdico, preferiblemente antes de las comidas. Pepto Bismol puede ayudar a Licensed conveyancer revestimiento del esfago; siga las indicaciones de su mdico sobre la frecuencia de [place]. No consuma ibuprofeno ni otros antiinflamatorios sin Public librarian, ya que pueden empeorar la irritacin esofgica. Use una almohada de cua al dormir para mantener la cabeza y el torso elevados, lo que reduce el riesgo de reflujo nocturno. Programe y roxine a su cita con el gastroenterlogo para una evaluacin ms detallada y posible biopsia de los plipos. Fibromialgia: Contine con los medicamentos recetados para el nimo y el sueo. Mantenga un registro de sus sntomas y comunique cualquier empeoramiento o efecto secundario. Si el dolor o la fatiga no mejoran, consulte a su mdico sobre la posibilidad de Multimedia programmer o agregar medicamentos como Lyrica o gabapentina. Colesterol y dieta: Evite alimentos procesados, carnes rojas, embutidos y frituras. Prefiera alimentos frescos, frutas, verduras, granos integrales y pescado. Mantenga actividad fsica regular, como caminar al menos 30 minutos diarios. Si tiene dudas sobre cmo modificar su dieta, solicite orientacin nutricional. Seguimiento: Programe citas de seguimiento segn lo recomendado. Si presenta nuevos sntomas como dificultad para tragar, sangrado, dolor intenso o fiebre, comunquese de inmediato con su mdico. Recuerde: Lleve una lista de sus medicamentos y sntomas a cada consulta. Siga las indicaciones mdicas y comunique cualquier cambio en su salud. Sus proveedores de atencin mdica  VISIT SUMMARY: During your visit, we discussed your severe abdominal pain, fatigue, and throat pain. We reviewed your history of fibromyalgia, esophageal polyps, and medication experiences. We also addressed your muscle pain related to statin use and your cholesterol management.  YOUR PLAN: -SILENT REFLUX  ESOPHAGITIS WITH LARYNGEAL INVOLVEMENT (LPR): This condition involves stomach acid flowing back into the esophagus, causing throat pain, hoarseness, and cough. We will start you on Nexium to  prevent reflux and Carafate to coat your esophagus. You should also use esophageal coating agents like Pepto Bismol. Avoid ibuprofen as it can irritate your esophagus, and try sleeping with a wedge pillow at a 30-degree angle to prevent reflux. We will refer you to a gastroenterologist for further evaluation and possible biopsy of your esophageal polyps.  -FIBROMYALGIA: Fibromyalgia is a chronic condition characterized by widespread pain and fatigue. We will continue your sertraline  for mood stabilization and trazodone for sleep. If your symptoms persist, we may consider Lyrica or gabapentin , though they can cause sedation.  -STATIN INTOLERANCE: Statin intolerance occurs when muscle pain develops as a side effect of statin medications. Since your muscle pain resolved after stopping atorvastatin , we will not restart it. Instead, we recommend managing your cholesterol through a healthy diet.  -HYPERLIPIDEMIA: Hyperlipidemia means having high levels of cholesterol in your blood. Given your intolerance to statins and genetic predisposition, we will focus on dietary changes to manage your cholesterol. Avoid processed meats and fried foods.  INSTRUCTIONS: Please follow up with a gastroenterologist for evaluation and possible biopsy of your esophageal polyps. Continue taking Nexium and Carafate as prescribed, and avoid ibuprofen. Use a wedge pillow to sleep at a 30-degree angle. Maintain a healthy diet to manage your cholesterol levels. If your fibromyalgia symptoms persist, we may consider Lyrica or gabapentin .  Your Providers PCP: Jesus Kathryn MATSU, MD,  [phone]) Referring Provider: Jesus Kathryn MATSU, MD,  [phone]) Care Team Provider: Harvey Margo CROME, MD Care Team Provider: Driscilla Wanda SQUIBB, RN Care  Team Provider: Harl Eleanor LABOR, NP,  [phone])  NEXT STEPS: [x]  Early Intervention: Schedule sooner appointment, call our on-call services, or go to emergency room if there is any significant Increase in pain or discomfort New or worsening symptoms Sudden or severe changes in your health [x]  Flexible Follow-Up: We recommend a Return in about 1 month (around 02/05/2024) for chronic disease monitoring and management. for optimal routine care. This allows for progress monitoring and treatment adjustments. [x]  Preventive Care: Schedule your annual preventive care visit! It's typically covered by insurance and helps identify potential health issues early. [x]  Lab & X-ray Appointments: Incomplete tests scheduled today, or call to schedule. X-rays: [facility] (M-F, 8:30am-noon or 1pm-5pm). [x]  Medical Information Release: Sign a release form at front desk to obtain relevant medical information we don't have.  MAKING THE MOST OF OUR FOCUSED 20 MINUTE APPOINTMENTS: [x]   Clearly state your top concerns at the beginning of the visit to focus our discussion [x]   If you anticipate you will need more time, please inform the front desk during scheduling - we can book multiple appointments in the same week. [x]   If you have transportation problems- use our convenient video appointments or ask about transportation support. [x]   We can get down to business faster if you use MyChart to update information before the visit and submit non-urgent questions before your visit. Thank you for taking the time to provide details through MyChart.  Let our nurse know and she can import this information into your encounter documents.  Arrival and Wait Times: [x]   Arriving on time ensures that everyone receives prompt attention. [x]   Early morning (8a) and afternoon (1p) appointments tend to have shortest wait times. [x]   Unfortunately, we cannot delay appointments for late arrivals or hold slots  during phone calls.  Getting Answers and Following Up [x]   Simple Questions & Concerns: For quick questions or basic follow-up after your visit, reach us  at [phone] or MyChart messaging. [x]   Complex Concerns: If your concern is more complex, scheduling an appointment might be best. Discuss this with the staff to find the most suitable option. [x]   Lab & Imaging Results: We'll contact you directly if results are abnormal or you don't use MyChart. Most normal results will be on MyChart within 2-3 business days, with a review message from Dr. Jesus. Haven't heard back in 2 weeks? Need results sooner? Contact us  at [phone]. [x]   Referrals: Our referral coordinator will manage specialist referrals. The specialist's office should contact you within 2 weeks to schedule an appointment. Call us  if you haven't heard from them after 2 weeks.  Staying Connected [x]   MyChart: Activate your MyChart for the fastest way to access results and message us . See the last page of this paperwork for instructions on how to activate.  Bring to Your Next Appointment [x]   Medications: Please bring all your medication bottles to your next appointment to ensure we have an accurate record of your prescriptions. [x]   Health Diaries: If you're monitoring any health conditions at home, keeping a diary of your readings can be very helpful for discussions at your next appointment.  Billing [x]   X-ray & Lab Orders: These are billed by separate companies. Contact the invoicing company directly for questions or concerns. [x]   Visit Charges: Discuss any billing inquiries with our administrative services team.  Your Satisfaction Matters [x]   Share Your Experience: We strive for your satisfaction! If you have any complaints, or preferably compliments, please let Dr. Jesus know directly or contact our Practice Administrators, Manuelita Rubin or [employer], by asking at the front desk.   Reviewing Your  Records [x]   Review this early draft of your clinical encounter notes below and the final encounter summary tomorrow on MyChart after its been completed.  All orders placed so far are visible here: Laryngopharyngeal reflux (LPR) -     Ambulatory referral to Gastroenterology -     Sucralfate; Take 1 tablet (1 g total) by mouth 4 (four) times daily -  with meals and at bedtime.  Dispense: 30 tablet; Refill: 0 -     Esomeprazole Magnesium; Take 1 capsule (40 mg total) by mouth daily.  Dispense: 30 capsule; Refill: 3  Immunization due -     Flu vaccine trivalent PF, 6mos and older(Flulaval,Afluria,Fluarix,Fluzone)  Epigastric pain  Throat pain  Chronic fatigue  Gastroesophageal reflux disease with esophagitis without hemorrhage -     Ambulatory referral to Gastroenterology -     Sucralfate; Take 1 tablet (1 g total) by mouth 4 (four) times daily -  with meals and at bedtime.  Dispense: 30 tablet; Refill: 0 -     Esomeprazole Magnesium; Take 1 capsule (40 mg total) by mouth daily.  Dispense: 30 capsule; Refill: 3  Esophageal polyp -     Ambulatory referral to Gastroenterology -     Sucralfate; Take 1 tablet (1 g total) by mouth 4 (four) times daily -  with meals and at bedtime.  Dispense: 30 tablet; Refill: 0 -     Esomeprazole Magnesium; Take 1 capsule (40 mg total) by mouth daily.  Dispense: 30 capsule; Refill: 3  Mixed obsessional thoughts and acts -     Sertraline  HCl; Take 1 tablet (25 mg total) by mouth daily.  Dispense: 90 tablet; Refill: 3  Primary insomnia -     Ambulatory referral to Psychiatry -     Ambulatory referral to Psychology -  traZODone HCl; Take 0.5-1 tablets (25-50 mg total) by mouth at bedtime as needed for sleep.  Dispense: 90 tablet; Refill: 3  Fibromyalgia -     Ambulatory referral to Psychiatry -     Ambulatory referral to Psychology -     Sertraline  HCl; Take 1 tablet (25 mg total) by mouth daily.  Dispense: 90 tablet; Refill: 3 -     traZODone HCl;  Take 0.5-1 tablets (25-50 mg total) by mouth at bedtime as needed for sleep.  Dispense: 90 tablet; Refill: 3  Statin intolerance  Intractable abdominal pain -     Journavx; Take 1 tablet by mouth 2 (two) times daily as needed.  Dispense: 60 tablet; Refill: 5 -     HYDROcodone-Acetaminophen; Take 1 tablet by mouth every 6 (six) hours as needed for moderate pain (pain score 4-6).  Dispense: 20 tablet; Refill: 0  Hypercholesterolemia without hypertriglyceridemia

## 2024-01-05 NOTE — Assessment & Plan Note (Signed)
 She declined professional interpreter, husband did good job.   Also provided AVS in spanish.

## 2024-01-05 NOTE — Progress Notes (Signed)
 ==============================  [facility]: [phone]   -- Medical Office Visit --  Patient: Kathryn Salas      Age: 58 y.o.       Sex:  female  Date:   01/05/2024 Today's Healthcare Provider: Bernardino KANDICE Cone, MD  ==============================   Chief Complaint: Fatigue and Pain  Discussed the use of AI scribe software for clinical note transcription with the patient, who gave verbal consent to proceed.  History of Present Illness 58 year old female with fibromyalgia who presents with severe abdominal pain and fatigue.  She has been experiencing severe abdominal pain for the past week, described as a 'cutting' sensation in the epigastric area, with a severity of 10 out of 10. There is no blood in stool or hematemesis. She has a history of esophageal polyps identified approximately 20 years ago, but no recent follow-up has been conducted.  Significant fatigue has been present, with low energy levels confining her to the couch last week. She has tried medications such as Cymbalta  and Lyrica in the past, but they caused adverse effects, including a dissociative experience with Cymbalta .  Intermittent throat pain is noted, associated with flu-like symptoms, resolving after a few days. No acid reflux symptoms, but throat pain is not related to acid. Omeprazole and famotidine have been tried without relief. Hoarseness and occasional cough at night are present. She reports that food sometimes gets stuck in the esophagus when everything comes together, and she also experiences occasional hoarseness and a sensation of burning in her throat.  She has a history of taking ibuprofen, which she believes caused problems with her esophagus after a previous endoscopy showed her esophagus was 'really bad.' She has since stopped taking ibuprofen due to its adverse effects on her esophagus.  Muscle pain is described as transient and migratory, sometimes triggered by  touch, and is described as 'crazy' and unpredictable. She has a history of restless leg syndrome and has stopped taking atorvastatin  due to muscle pain.  She takes sertraline  in the morning and has been prescribed trazodone for sleep. She has stopped taking mirtazapine  due to adverse effects.  Background Reviewed: Problem List: has Laryngopharyngeal reflux (LPR); Chronic back pain; History of colonic polyps; History of asthma; Chronic maxillary sinusitis; Allergic rhinitis; ETD (Eustachian tube dysfunction), bilateral; Language barrier affecting health care; Nephrolithiasis; Chest tightness; Chronic fatigue; Hyperlipidemia; History of toxoplasmosis; Vitamin D  deficiency; Generalized anxiety disorder; Panic disorder; Mixed obsessional thoughts and acts; Gluten intolerance; Myalgia due to statin; Hair loss; Paresthesia; Statin myopathy; Fibromyalgia; Esophageal polyp; Intractable abdominal pain; Statin intolerance; Primary insomnia; and Hypercholesterolemia without hypertriglyceridemia on their problem list. 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 Past Surgical History:   has no past surgical history on file. Social History:   reports that she has never smoked. She has never used smokeless tobacco. She reports that she does not drink alcohol and does not use drugs. Family History:  family history includes Breast cancer in her maternal aunt; Cancer in her father, maternal aunt, and paternal grandmother; Depression in her mother; Hypertension in her mother. Allergies:  is allergic to codeine, morphine and codeine, latex, and pamelor [nortriptyline hcl].   Medication Reconciliation: Current Outpatient Medications on  File Prior to Visit  Medication Sig   albuterol  (VENTOLIN  HFA)  108 (90 Base) MCG/ACT inhaler Inhale 2 puffs into the lungs every 6 (six) hours as needed for wheezing or shortness of breath.   fluticasone  (FLONASE ) 50 MCG/ACT nasal spray Place 2 sprays into both nostrils daily. Use after simply saline sinus rinse at night   ipratropium-albuterol  (DUONEB) 0.5-2.5 (3) MG/3ML SOLN Take 3 mLs by nebulization every 4 (four) hours as needed.   loratadine  (CLARITIN ) 10 MG tablet Take 1 tablet (10 mg total) by mouth daily.   Multiple Vitamins-Minerals (CENTRUM MINIS WOMEN 50+) TABS Take 1 tablet by mouth daily at 6 (six) AM.   promethazine -dextromethorphan  (PROMETHAZINE -DM) 6.25-15 MG/5ML syrup Take 5 mLs by mouth 4 (four) times daily as needed.   Vitamin D , Ergocalciferol , (DRISDOL ) 1.25 MG (50000 UNIT) CAPS capsule Take 1 capsule (50,000 Units total) by mouth every 7 (seven) days. Once WEEKLY   No current facility-administered medications on file prior to visit.   Medications Discontinued During This Encounter  Medication Reason   DULoxetine  (CYMBALTA ) 30 MG capsule    ibuprofen (ADVIL) 800 MG tablet Completed Course   MELOXICAM PO Completed Course   LORazepam  (ATIVAN ) 1 MG tablet Completed Course   mirtazapine  (REMERON ) 7.5 MG tablet Completed Course   atorvastatin  (LIPITOR) 10 MG tablet Side effect (s)   sertraline  (ZOLOFT ) 25 MG tablet Reorder     Physical Exam:    01/05/2024    1:14 PM 11/24/2023    2:44 PM 10/22/2023   11:07 AM  Vitals with BMI  Height 5' 4 5' 4 5' 4  Weight 145 lbs 3 oz 149 lbs 149 lbs 3 oz  BMI 24.91 25.56 25.6  Systolic 130 110 879  Diastolic 60 62 76  Pulse 66 88 67  Vital signs reviewed.  Nursing notes reviewed. Weight trend reviewed. Physical Activity: Not on file   General Appearance:  No acute distress appreciable.   Well-groomed, healthy-appearing female.  Well proportioned with no abnormal fat distribution.  Good muscle tone. Pulmonary:  Normal work of  breathing at rest, no respiratory distress apparent. SpO2: 98 %  Musculoskeletal: All extremities are intact.  Neurological:  Awake, alert, oriented, and engaged.  No obvious focal neurological deficits or cognitive impairments.  Sensorium seems unclouded.   Speech is clear and coherent with logical content. Psychiatric:  Appropriate mood, pleasant and cooperative demeanor, thoughtful and engaged during the exam      06/06/2023    4:09 PM 07/03/2022    8:27 AM  PHQ 2/9 Scores  PHQ - 2 Score 0 0     Hospital Outpatient Visit on 12/19/2023  Component Date Value Ref Range Status   Genetic Analysis Overall Interpret* 12/19/2023 Negative   Final   Genetic Disease Assessed 12/19/2023    Final                   Value:This is a screening test and does not detect all pathogenic or likely pathogenic variant(s) in the tested genes; diagnostic testing is recommended for individuals with a personal or family history of heart disease or hereditary cancer. [employer] One  Population Screen is a screening test that analyzes 11 genes related to hereditary breast and ovarian cancer (HBOC) syndrome, Lynch syndrome, and familial hypercholesterolemia. This test only reports clinically significant pathogenic and likely  pathogenic variants but does not report variants of uncertain significance (VUS). In addition, analysis of the PMS2 gene excludes exons 11-15, which overlap with a known pseudogene (PMS2CL).    Genetic Analysis Report 12/19/2023    Final  Value:No pathogenic or likely pathogenic variants were detected in the genes analyzed by this test.Genetic test results should be interpreted in the context of an individual's personal medical and family history. Alteration to medical management is NOT  recommended based solely on this result. Clinical correlation is advised.Additional Considerations- This is a screening test; individuals may still carry pathogenic or likely pathogenic variant(s)  in the tested genes that are not detected by this test.-  For individuals at risk for these or other related conditions based on factors including personal or family history, diagnostic testing is recommended.- The absence of pathogenic or likely pathogenic variant(s) in the analyzed genes, while reassuring,  does not eliminate the possibility of a hereditary condition; there are other variants and genes associated with heart disease and hereditary cancer that are not included in this test.    Genes Tested 12/19/2023 See Notes   Final   Disclaimer 12/19/2023 See Notes   Final   Sequencing Location 12/19/2023 See Notes   Final   Interpretation Methods and Limitat* 12/19/2023 See Notes   Final  Office Visit on 10/22/2023  Component Date Value Ref Range Status   Total Volume 10/24/2023 1,300  mL Final   Epinephrine, 24H, Ur 10/24/2023 7  2 - 24 mcg/24 h Final   Norepinephrine, 24H, Ur 10/24/2023 19  15 - 100 mcg/24 h Final   Calc Total (E+NE) 10/24/2023 26  26 - 121 mcg/24 h Final   Dopamine 24 Hr Urine 10/24/2023 174  52 - 480 mcg/24 h Final   Creatinine, Urine mg/day-CATEUR 10/24/2023 0.80  0.50 - 2.15 g/24 h Final   Total Volume 10/24/2023 1,300  mL Final   5 HIAA, 24 Hour Urine 10/24/2023 4.1  < OR = 6.0 mg/24 h Final   QUESTION/PROBLEM 10/24/2023    Final   UNCLEAR ORDER: 10/24/2023 VERY PRIORITY   Final   SPECIMEN(S) SUBMITTED 10/24/2023 UF   Final   Total Volume 10/24/2023 1,300  mL Final   5 HIAA, 24 Hour Urine 10/24/2023 4.1  < OR = 6.0 mg/24 h Final  Hospital Outpatient Visit on 10/06/2023  Component Date Value Ref Range Status   Uric Acid, Serum 10/06/2023 5.8  2.5 - 7.1 mg/dL Final   Cholesterol 92/78/7974 220 (H)  0 - 200 mg/dL Final   Triglycerides 92/78/7974 61  <150 mg/dL Final   HDL 92/78/7974 66  >40 mg/dL Final   Total CHOL/HDL Ratio 10/06/2023 3.3  RATIO Final   VLDL 10/06/2023 12  0 - 40 mg/dL Final   LDL Cholesterol 10/06/2023 142 (H)  0 - 99 mg/dL Final   CCP  Antibodies IgG/IgA 10/06/2023 4  0 - 19 units Final   Anti Nuclear Antibody (ANA) 10/06/2023 Negative  Negative Final   SSA (Ro) (ENA) Antibody, IgG 10/06/2023 0.2  0.0 - 0.9 AI Final   SSB (La) (ENA) Antibody, IgG 10/06/2023 <0.2  0.0 - 0.9 AI Final   Acety choline binding ab 10/06/2023 <0.07  0.00 - 0.24 nmol/L Final   Lyme Total Antibody EIA 10/06/2023 Negative  Negative Final   C3 Complement 10/06/2023 186 (H)  82 - 167 mg/dL Final   Complement C4, Body Fluid 10/06/2023 46 (H)  12 - 38 mg/dL Final   Toxoplasma IgG Ratio 10/06/2023 256.0 (H)  0.0 - 7.1 IU/mL Final   Toxoplasma Antibody- IgM 10/06/2023 <3.0  0.0 - 7.9 AU/mL Final   Comment 10/06/2023 Comment   Final   Sed Rate 10/06/2023 28  0 - 30 mm/hr Final  CRP 10/06/2023 0.6  <1.0 mg/dL Final   Total CK 92/78/7974 111  38 - 234 U/L Final   Iron 10/06/2023 75  28 - 170 ug/dL Final   TIBC 92/78/7974 391  250 - 450 ug/dL Final   Saturation Ratios 10/06/2023 19  10.4 - 31.8 % Final   UIBC 10/06/2023 316  ug/dL Final   Ferritin 92/78/7974 60  11 - 307 ng/mL Final   Specimen Source 10/06/2023 URINE, CLEAN CATCH   Final   Color, Urine 10/06/2023 YELLOW  YELLOW Final   APPearance 10/06/2023 CLEAR  CLEAR Final   Specific Gravity, Urine 10/06/2023 1.020  1.005 - 1.030 Final   pH 10/06/2023 5.0  5.0 - 8.0 Final   Glucose, UA 10/06/2023 NEGATIVE  NEGATIVE mg/dL Final   Hgb urine dipstick 10/06/2023 NEGATIVE  NEGATIVE Final   Bilirubin Urine 10/06/2023 NEGATIVE  NEGATIVE Final   Ketones, ur 10/06/2023 NEGATIVE  NEGATIVE mg/dL Final   Protein, ur 92/78/7974 NEGATIVE  NEGATIVE mg/dL Final   Nitrite 92/78/7974 NEGATIVE  NEGATIVE Final   Leukocytes,Ua 10/06/2023 NEGATIVE  NEGATIVE Final   RBC / HPF 10/06/2023 0-5  0 - 5 RBC/hpf Final   WBC, UA 10/06/2023 0-5  0 - 5 WBC/hpf Final   Bacteria, UA 10/06/2023 NONE SEEN  NONE SEEN Final   Squamous Epithelial / HPF 10/06/2023 0-5  0 - 5 /HPF Final   Mucus 10/06/2023 PRESENT   Final   Retic Ct Pct  10/06/2023 0.8  0.4 - 3.1 % Final   RBC. 10/06/2023 5.01  3.87 - 5.11 MIL/uL Final   Retic Count, Absolute 10/06/2023 39.6  19.0 - 186.0 K/uL Final   Immature Retic Fract 10/06/2023 7.2  2.3 - 15.9 % Final   IgG (Immunoglobin G), Serum 10/06/2023 1,183  586 - 1,602 mg/dL Final   IgA 92/78/7974 167  87 - 352 mg/dL Final   IgM (Immunoglobulin M), Srm 10/06/2023 151  26 - 217 mg/dL Final  Admission on 92/79/7974, Discharged on 10/05/2023  Component Date Value Ref Range Status   Glucose-Capillary 10/05/2023 94  70 - 99 mg/dL Final   Magnesium 92/79/7974 2.1  1.7 - 2.4 mg/dL Final   Sodium 92/79/7974 139  135 - 145 mmol/L Final   Potassium 10/05/2023 3.4 (L)  3.5 - 5.1 mmol/L Final   Chloride 10/05/2023 102  98 - 111 mmol/L Final   CO2 10/05/2023 22  22 - 32 mmol/L Final   Glucose, Bld 10/05/2023 98  70 - 99 mg/dL Final   BUN 92/79/7974 19  6 - 20 mg/dL Final   Creatinine, Ser 10/05/2023 0.80  0.44 - 1.00 mg/dL Final   Calcium  10/05/2023 9.9  8.9 - 10.3 mg/dL Final   Total Protein 92/79/7974 8.2 (H)  6.5 - 8.1 g/dL Final   Albumin 92/79/7974 4.6  3.5 - 5.0 g/dL Final   AST 92/79/7974 29  15 - 41 U/L Final   ALT 10/05/2023 23  0 - 44 U/L Final   Alkaline Phosphatase 10/05/2023 70  38 - 126 U/L Final   Total Bilirubin 10/05/2023 0.6  0.0 - 1.2 mg/dL Final   GFR, Estimated 10/05/2023 >60  >60 mL/min Final   Anion gap 10/05/2023 15  5 - 15 Final   Troponin I (High Sensitivity) 10/05/2023 3  <18 ng/L Final   WBC 10/05/2023 6.9  4.0 - 10.5 K/uL Final   RBC 10/05/2023 4.96  3.87 - 5.11 MIL/uL Final   Hemoglobin 10/05/2023 14.4  12.0 - 15.0 g/dL Final  HCT 10/05/2023 43.1  36.0 - 46.0 % Final   MCV 10/05/2023 86.9  80.0 - 100.0 fL Final   MCH 10/05/2023 29.0  26.0 - 34.0 pg Final   MCHC 10/05/2023 33.4  30.0 - 36.0 g/dL Final   RDW 92/79/7974 11.9  11.5 - 15.5 % Final   Platelets 10/05/2023 260  150 - 400 K/uL Final   nRBC 10/05/2023 0.0  0.0 - 0.2 % Final   Neutrophils Relative %  10/05/2023 42  % Final   Neutro Abs 10/05/2023 2.9  1.7 - 7.7 K/uL Final   Lymphocytes Relative 10/05/2023 48  % Final   Lymphs Abs 10/05/2023 3.3  0.7 - 4.0 K/uL Final   Monocytes Relative 10/05/2023 7  % Final   Monocytes Absolute 10/05/2023 0.5  0.1 - 1.0 K/uL Final   Eosinophils Relative 10/05/2023 2  % Final   Eosinophils Absolute 10/05/2023 0.2  0.0 - 0.5 K/uL Final   Basophils Relative 10/05/2023 1  % Final   Basophils Absolute 10/05/2023 0.0  0.0 - 0.1 K/uL Final   Immature Granulocytes 10/05/2023 0  % Final   Abs Immature Granulocytes 10/05/2023 0.02  0.00 - 0.07 K/uL Final   D-Dimer, Quant 10/05/2023 0.36  0.00 - 0.50 ug/mL-FEU Final   Troponin I (High Sensitivity) 10/05/2023 3  <18 ng/L Final  Hospital Outpatient Visit on 09/17/2023  Component Date Value Ref Range Status   TSH 09/17/2023 1.490  0.450 - 4.500 uIU/mL Final   T4, Total 09/17/2023 8.2  4.5 - 12.0 ug/dL Final   T3 Uptake Ratio 09/17/2023 24  24 - 39 % Final   Free Thyroxine Index 09/17/2023 2.0  1.2 - 4.9 Final   CRP 09/17/2023 0.7  <1.0 mg/dL Final   Sed Rate 92/97/7974 30  0 - 30 mm/hr Final   Rheumatoid fact SerPl-aCnc 09/17/2023 <10.0  <14.0 IU/mL Final   Folate 09/17/2023 16.8  >5.9 ng/mL Final   Vitamin B-12 09/17/2023 319  180 - 914 pg/mL Final   Vit D, 25-Hydroxy 09/17/2023 25.36 (L)  30 - 100 ng/mL Final   CCP Antibodies IgG/IgA 09/17/2023 11  0 - 19 units Final  Hospital Outpatient Visit on 09/04/2023  Component Date Value Ref Range Status   Vit D, 25-Hydroxy 09/04/2023 25.75 (L)  30 - 100 ng/mL Final   WBC 09/04/2023 6.8  4.0 - 10.5 K/uL Final   RBC 09/04/2023 4.54  3.87 - 5.11 MIL/uL Final   Hemoglobin 09/04/2023 13.6  12.0 - 15.0 g/dL Final   HCT 93/80/7974 39.9  36.0 - 46.0 % Final   MCV 09/04/2023 87.9  80.0 - 100.0 fL Final   MCH 09/04/2023 30.0  26.0 - 34.0 pg Final   MCHC 09/04/2023 34.1  30.0 - 36.0 g/dL Final   RDW 93/80/7974 12.2  11.5 - 15.5 % Final   Platelets 09/04/2023 211  150 -  400 K/uL Final   nRBC 09/04/2023 0.0  0.0 - 0.2 % Final   Neutrophils Relative % 09/04/2023 59  % Final   Neutro Abs 09/04/2023 4.0  1.7 - 7.7 K/uL Final   Lymphocytes Relative 09/04/2023 31  % Final   Lymphs Abs 09/04/2023 2.1  0.7 - 4.0 K/uL Final   Monocytes Relative 09/04/2023 7  % Final   Monocytes Absolute 09/04/2023 0.5  0.1 - 1.0 K/uL Final   Eosinophils Relative 09/04/2023 3  % Final   Eosinophils Absolute 09/04/2023 0.2  0.0 - 0.5 K/uL Final   Basophils Relative 09/04/2023 0  %  Final   Basophils Absolute 09/04/2023 0.0  0.0 - 0.1 K/uL Final   Immature Granulocytes 09/04/2023 0  % Final   Abs Immature Granulocytes 09/04/2023 0.01  0.00 - 0.07 K/uL Final   TSH 09/04/2023 1.280  0.450 - 4.500 uIU/mL Final   T4, Total 09/04/2023 9.1  4.5 - 12.0 ug/dL Final   T3 Uptake Ratio 09/04/2023 28  24 - 39 % Final   Free Thyroxine Index 09/04/2023 2.5  1.2 - 4.9 Final  Office Visit on 06/06/2023  Component Date Value Ref Range Status   Amylase 06/06/2023 32  21 - 101 U/L Final   WBC 06/06/2023 9.3  3.8 - 10.8 Thousand/uL Final   RBC 06/06/2023 5.04  3.80 - 5.10 Million/uL Final   Hemoglobin 06/06/2023 14.2  11.7 - 15.5 g/dL Final   HCT 96/78/7974 43.0  35.0 - 45.0 % Final   MCV 06/06/2023 85.3  80.0 - 100.0 fL Final   MCH 06/06/2023 28.2  27.0 - 33.0 pg Final   MCHC 06/06/2023 33.0  32.0 - 36.0 g/dL Final   RDW 96/78/7974 12.4  11.0 - 15.0 % Final   Platelets 06/06/2023 231  140 - 400 Thousand/uL Final   MPV 06/06/2023 10.8  7.5 - 12.5 fL Final   Neutro Abs 06/06/2023 4,455  1,500 - 7,800 cells/uL Final   Absolute Lymphocytes 06/06/2023 4,204 (H)  850 - 3,900 cells/uL Final   Absolute Monocytes 06/06/2023 586  200 - 950 cells/uL Final   Eosinophils Absolute 06/06/2023 37  15 - 500 cells/uL Final   Basophils Absolute 06/06/2023 19  0 - 200 cells/uL Final   Neutrophils Relative % 06/06/2023 47.9  % Final   Total Lymphocyte 06/06/2023 45.2  % Final   Monocytes Relative 06/06/2023 6.3   % Final   Eosinophils Relative 06/06/2023 0.4  % Final   Basophils Relative 06/06/2023 0.2  % Final   Glucose, Bld 06/06/2023 86  65 - 99 mg/dL Final   BUN 96/78/7974 26 (H)  7 - 25 mg/dL Final   Creat 96/78/7974 0.92  0.50 - 1.03 mg/dL Final   eGFR 96/78/7974 73  > OR = 60 mL/min/1.64m2 Final   BUN/Creatinine Ratio 06/06/2023 28 (H)  6 - 22 (calc) Final   Sodium 06/06/2023 141  135 - 146 mmol/L Final   Potassium 06/06/2023 3.7  3.5 - 5.3 mmol/L Final   Chloride 06/06/2023 103  98 - 110 mmol/L Final   CO2 06/06/2023 27  20 - 32 mmol/L Final   Calcium  06/06/2023 9.4  8.6 - 10.4 mg/dL Final   Total Protein 96/78/7974 7.7  6.1 - 8.1 g/dL Final   Albumin 96/78/7974 4.4  3.6 - 5.1 g/dL Final   Globulin 96/78/7974 3.3  1.9 - 3.7 g/dL (calc) Final   AG Ratio 06/06/2023 1.3  1.0 - 2.5 (calc) Final   Total Bilirubin 06/06/2023 0.3  0.2 - 1.2 mg/dL Final   Alkaline phosphatase (APISO) 06/06/2023 60  37 - 153 U/L Final   AST 06/06/2023 16  10 - 35 U/L Final   ALT 06/06/2023 14  6 - 29 U/L Final   Hepatitis C Ab 06/06/2023 NON-REACTIVE  NON-REACTIVE Final   HIV 1&2 Ab, 4th Generation 06/06/2023 NON-REACTIVE  NON-REACTIVE Final   Lipase 06/06/2023 40  7 - 60 U/L Final  Admission on 11/12/2022, Discharged on 11/12/2022  Component Date Value Ref Range Status   Color, UA 11/12/2022 yellow  yellow Final   Clarity, UA 11/12/2022 hazy (A)  clear Final  Glucose, UA 11/12/2022 negative  negative mg/dL Final   Bilirubin, UA 91/72/7975 negative  negative Final   Ketones, POC UA 11/12/2022 negative  negative mg/dL Final   Spec Grav, UA 91/72/7975 >=1.030 (A)  1.010 - 1.025 Final   Blood, UA 11/12/2022 trace-intact (A)  negative Final   pH, UA 11/12/2022 5.5  5.0 - 8.0 Final   Protein Ur, POC 11/12/2022 negative  negative mg/dL Final   Urobilinogen, UA 11/12/2022 0.2  0.2 or 1.0 E.U./dL Final   Nitrite, UA 91/72/7975 Negative  Negative Final   Leukocytes, UA 11/12/2022 Negative  Negative Final    Specimen Description 11/12/2022    Final                   Value:URINE, CLEAN CATCH Performed at [facility], [address] [place], [place] [address]    Special Requests 11/12/2022    Final                   Value:NONE Performed at [facility], [address], [place], [place] [address]    Culture 11/12/2022    Final                   Value:NO GROWTH Performed at [facility], [address] [place], [place] [address]    Report Status 11/12/2022 11/13/2022 FINAL   Final  Office Visit on 07/03/2022  Component Date Value Ref Range Status   COLOGUARD 09/05/2022 Negative  Negative Final  No image results found. No results found.       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 There are no bleeding or life-threatening symptoms, but ibuprofen likely exacerbates the condition. Differential diagnosis includes eosinophilic esophagitis and esophageal polyps, with a history of polyps 20 years ago requiring evaluation for malignancy. Prescribe Nexium to prevent reflux and Carafate to coat the esophagus. Recommend esophageal coating agents like Pepto Bismol. Refer to a gastroenterologist for evaluation and possible biopsy of esophageal polyps. Advise against ibuprofen use due to esophageal irritation and recommend sleeping with a wedge pillow at 30 degrees to prevent reflux. 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 Cymbalta  previously caused dissociative symptoms. Lyrica and gabapentin  are potential treatments but may cause sedation. Continue sertraline  for mood stabilization and prescribe trazodone for sleep. Consider Lyrica or gabapentin  if symptoms persist. Statin intolerance Hypercholesterolemia without hypertriglyceridemia Updated problem overview for this problem to improve longitudinal management. Muscle pain associated with atorvastatin  use resolved upon discontinuation, indicating statin intolerance, common in 15% of patients. Discontinue atorvastatin  and advise on a healthy diet to manage cholesterol levels.Management is complicated by statin intolerance and genetic predisposition to high cholesterol. Emphasize dietary modifications to manage cholesterol, including avoiding processed meats and fried foods.   I believe 09/2023 chest pain was due to silent reflux based on today's visit so seems not worth treating at this time. Language barrier affecting health care She declined professional interpreter, husband did good job.   Also provided AVS in spanish.   ORDER ASSOCIATIONS  #   DIAGNOSIS /  CONDITION ICD-10 ENCOUNTER ORDER     ICD-10-CM   1. Laryngopharyngeal reflux (LPR)  K21.9 Ambulatory referral to Gastroenterology    sucralfate (CARAFATE) 1 g tablet    esomeprazole (NEXIUM) 40 MG capsule    2. Immunization due  Z23 Flu vaccine trivalent PF, 6mos and older(Flulaval,Afluria,Fluarix,Fluzone)    3. Epigastric pain  R10.13     4. Throat pain  R07.0     5. Chronic fatigue  R53.82     6. Gastroesophageal reflux disease with esophagitis without hemorrhage  K21.00 Ambulatory referral to Gastroenterology    sucralfate (CARAFATE) 1 g tablet    esomeprazole (NEXIUM) 40 MG capsule    7. Esophageal polyp  K22.81 Ambulatory referral to Gastroenterology    sucralfate (CARAFATE) 1 g tablet    esomeprazole (NEXIUM) 40 MG capsule    8. Mixed obsessional thoughts and acts  F42.2 sertraline   (ZOLOFT ) 25 MG tablet    9. Primary insomnia  F51.01 Ambulatory referral to Psychiatry    Ambulatory referral to Psychology    traZODone (DESYREL) 50 MG tablet    10. Fibromyalgia  M79.7 Ambulatory referral to Psychiatry    Ambulatory referral to Psychology    sertraline  (ZOLOFT ) 25 MG tablet    traZODone (DESYREL) 50 MG tablet    11. Statin intolerance  Z78.9     12. Intractable abdominal pain  R10.9 Suzetrigine (JOURNAVX) 50 MG TABS    HYDROcodone-acetaminophen (NORCO/VICODIN) 5-325 MG tablet    13. Hypercholesterolemia without hypertriglyceridemia  E78.00      Referral Orders         Ambulatory referral to Gastroenterology         Ambulatory referral to Psychiatry         Ambulatory referral to Psychology     Meds ordered this encounter  Medications   sucralfate (CARAFATE) 1 g tablet    Sig: Take 1 tablet (1 g total) by mouth 4 (four) times daily -  with meals and at bedtime.    Dispense:  30 tablet    Refill:  0   esomeprazole (NEXIUM) 40 MG capsule    Sig: Take 1 capsule (40 mg total) by mouth daily.    Dispense:  30 capsule    Refill:  3   sertraline  (ZOLOFT ) 25 MG tablet    Sig: Take 1 tablet (25 mg total) by mouth daily.    Dispense:  90 tablet    Refill:  3   traZODone (DESYREL) 50 MG tablet    Sig: Take 0.5-1 tablets (25-50 mg total) by mouth at bedtime as needed for sleep.    Dispense:  90 tablet    Refill:  3   Suzetrigine (JOURNAVX) 50 MG TABS    Sig: Take 1 tablet by mouth 2 (two) times daily as needed.    Dispense:  60 tablet    Refill:  5   HYDROcodone-acetaminophen (NORCO/VICODIN) 5-325 MG tablet    Sig: Take 1 tablet by mouth every 6 (six) hours as needed for moderate pain (pain score 4-6).    Dispense:  20 tablet    Refill:  0    Orders Placed This Encounter  Procedures   Flu vaccine trivalent PF, 6mos and older(Flulaval,Afluria,Fluarix,Fluzone)   Ambulatory referral to Gastroenterology    Referral Priority:   Urgent    Referral Type:    Consultation    Referral Reason:   Specialty Services Required    Number of Visits Requested:   1   Ambulatory referral to  Psychiatry    Referral Priority:   Routine    Referral Reason:   Specialty Services Required    Number of Visits Requested:   1   Ambulatory referral to Psychology    Referral Priority:   Routine    Referral Reason:   Specialty Services Required    Number of Visits Requested:   1        This document was synthesized by artificial intelligence ([employer]) using HIPAA-compliant recording of the clinical interaction;   We discussed the use of AI scribe software for clinical note transcription with the patient, who gave verbal consent to proceed. additional Info: This encounter employed state-of-the-art, real-time, collaborative documentation. The patient actively reviewed and assisted in updating their electronic medical record on a shared screen, ensuring transparency and facilitating joint problem-solving for the problem list, overview, and plan. This approach promotes accurate, informed care. The treatment plan was discussed and reviewed in detail, including medication safety, potential side effects, and all patient questions. We confirmed understanding and comfort with the plan. Follow-up instructions were established, including contacting the office for any concerns, returning if symptoms worsen, persist, or new symptoms develop, and precautions for potential emergency department visits.

## 2024-01-05 NOTE — Assessment & Plan Note (Signed)
 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 There are no bleeding or life-threatening symptoms, but ibuprofen likely exacerbates the condition. Differential diagnosis includes eosinophilic esophagitis and esophageal polyps, with a history of polyps 20 years ago requiring evaluation for malignancy. Prescribe Nexium to prevent reflux and Carafate to coat the esophagus. Recommend esophageal coating agents like Pepto Bismol. Refer to a gastroenterologist for evaluation and possible biopsy of esophageal polyps. Advise against ibuprofen use due to esophageal irritation and recommend sleeping with a wedge pillow at 30 degrees to prevent reflux.

## 2024-01-05 NOTE — Assessment & Plan Note (Signed)
 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 Cymbalta  previously caused dissociative symptoms. Lyrica and gabapentin  are potential treatments but may cause sedation. Continue sertraline  for mood stabilization and prescribe trazodone for sleep. Consider Lyrica or gabapentin  if symptoms persist.

## 2024-01-05 NOTE — Assessment & Plan Note (Signed)
 Updated problem overview for this problem to improve longitudinal management. Muscle pain associated with atorvastatin  use resolved upon discontinuation, indicating statin intolerance, common in 15% of patients. Discontinue atorvastatin  and advise on a healthy diet to manage cholesterol levels.Management is complicated by statin intolerance and genetic predisposition to high cholesterol. Emphasize dietary modifications to manage cholesterol, including avoiding processed meats and fried foods.   I believe 09/2023 chest pain was due to silent reflux based on today's visit so seems not worth treating at this time.

## 2024-01-06 ENCOUNTER — Other Ambulatory Visit (HOSPITAL_BASED_OUTPATIENT_CLINIC_OR_DEPARTMENT_OTHER): Payer: Self-pay

## 2024-01-27 ENCOUNTER — Encounter: Payer: Self-pay | Admitting: Internal Medicine

## 2024-02-09 ENCOUNTER — Ambulatory Visit: Payer: Self-pay | Admitting: Internal Medicine

## 2024-02-10 ENCOUNTER — Encounter: Payer: Self-pay | Admitting: Internal Medicine

## 2024-03-05 ENCOUNTER — Ambulatory Visit: Payer: Self-pay | Admitting: Gastroenterology

## 2024-04-02 ENCOUNTER — Other Ambulatory Visit: Payer: Self-pay | Admitting: Internal Medicine

## 2024-04-02 ENCOUNTER — Other Ambulatory Visit (HOSPITAL_BASED_OUTPATIENT_CLINIC_OR_DEPARTMENT_OTHER): Payer: Self-pay

## 2024-04-02 DIAGNOSIS — K219 Gastro-esophageal reflux disease without esophagitis: Secondary | ICD-10-CM

## 2024-04-02 DIAGNOSIS — K2281 Esophageal polyp: Secondary | ICD-10-CM

## 2024-04-02 DIAGNOSIS — K2100 Gastro-esophageal reflux disease with esophagitis, without bleeding: Secondary | ICD-10-CM

## 2024-04-04 MED ORDER — SUCRALFATE 1 G PO TABS
1.0000 g | ORAL_TABLET | Freq: Three times a day (TID) | ORAL | 0 refills | Status: AC
  Filled 2024-04-04: qty 30, 8d supply, fill #0

## 2024-04-05 ENCOUNTER — Other Ambulatory Visit (HOSPITAL_BASED_OUTPATIENT_CLINIC_OR_DEPARTMENT_OTHER): Payer: Self-pay

## 2024-04-19 ENCOUNTER — Encounter (HOSPITAL_BASED_OUTPATIENT_CLINIC_OR_DEPARTMENT_OTHER): Payer: Self-pay

## 2024-04-19 ENCOUNTER — Other Ambulatory Visit (HOSPITAL_BASED_OUTPATIENT_CLINIC_OR_DEPARTMENT_OTHER): Payer: Self-pay
